# Patient Record
Sex: Female | Born: 1985 | Race: White | Hispanic: No | Marital: Married | Smoking: Never smoker
Health system: Southern US, Community
[De-identification: ages and names within clinical notes are randomized; demographics above are authoritative.]

## PROBLEM LIST (undated history)

## (undated) ENCOUNTER — Inpatient Hospital Stay (HOSPITAL_COMMUNITY): Payer: Self-pay

## (undated) DIAGNOSIS — Z8619 Personal history of other infectious and parasitic diseases: Secondary | ICD-10-CM

## (undated) DIAGNOSIS — IMO0002 Reserved for concepts with insufficient information to code with codable children: Secondary | ICD-10-CM

## (undated) DIAGNOSIS — B009 Herpesviral infection, unspecified: Secondary | ICD-10-CM

## (undated) DIAGNOSIS — N946 Dysmenorrhea, unspecified: Secondary | ICD-10-CM

## (undated) DIAGNOSIS — F329 Major depressive disorder, single episode, unspecified: Secondary | ICD-10-CM

## (undated) DIAGNOSIS — J45909 Unspecified asthma, uncomplicated: Secondary | ICD-10-CM

## (undated) DIAGNOSIS — F419 Anxiety disorder, unspecified: Secondary | ICD-10-CM

## (undated) DIAGNOSIS — F32A Depression, unspecified: Secondary | ICD-10-CM

## (undated) DIAGNOSIS — D649 Anemia, unspecified: Secondary | ICD-10-CM

## (undated) DIAGNOSIS — A64 Unspecified sexually transmitted disease: Secondary | ICD-10-CM

## (undated) DIAGNOSIS — D219 Benign neoplasm of connective and other soft tissue, unspecified: Secondary | ICD-10-CM

## (undated) HISTORY — DX: Anxiety disorder, unspecified: F41.9

## (undated) HISTORY — DX: Depression, unspecified: F32.A

## (undated) HISTORY — DX: Major depressive disorder, single episode, unspecified: F32.9

## (undated) HISTORY — DX: Dysmenorrhea, unspecified: N94.6

## (undated) HISTORY — DX: Unspecified sexually transmitted disease: A64

## (undated) HISTORY — DX: Reserved for concepts with insufficient information to code with codable children: IMO0002

---
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
## 2005-05-13 ENCOUNTER — Emergency Department (HOSPITAL_COMMUNITY): Admission: EM | Admit: 2005-05-13 | Discharge: 2005-05-13 | Payer: Self-pay | Admitting: Emergency Medicine

## 2007-09-30 ENCOUNTER — Emergency Department (HOSPITAL_COMMUNITY): Admission: EM | Admit: 2007-09-30 | Discharge: 2007-09-30 | Payer: Self-pay | Admitting: Emergency Medicine

## 2008-03-13 ENCOUNTER — Other Ambulatory Visit: Admission: RE | Admit: 2008-03-13 | Discharge: 2008-03-13 | Payer: Self-pay | Admitting: Family Medicine

## 2009-06-21 ENCOUNTER — Emergency Department (HOSPITAL_COMMUNITY): Admission: EM | Admit: 2009-06-21 | Discharge: 2009-06-21 | Payer: Self-pay | Admitting: Emergency Medicine

## 2010-04-26 ENCOUNTER — Emergency Department (HOSPITAL_COMMUNITY): Admission: EM | Admit: 2010-04-26 | Discharge: 2010-04-26 | Payer: Self-pay | Admitting: Family Medicine

## 2010-04-29 ENCOUNTER — Emergency Department (HOSPITAL_COMMUNITY): Admission: EM | Admit: 2010-04-29 | Discharge: 2010-04-29 | Payer: Self-pay | Admitting: Family Medicine

## 2010-08-24 LAB — POCT URINALYSIS DIP (DEVICE)
Bilirubin Urine: NEGATIVE
Glucose, UA: NEGATIVE mg/dL
Ketones, ur: NEGATIVE mg/dL
Nitrite: NEGATIVE
Protein, ur: >=300 mg/dL — AB
Specific Gravity, Urine: >=1.03 (ref 1.005–1.030)
Urobilinogen, UA: 0.2 mg/dL (ref 0.0–1.0)
pH: 6.5 (ref 5.0–8.0)

## 2010-08-24 LAB — POCT PREGNANCY, URINE: Preg Test, Ur: NEGATIVE

## 2011-03-03 LAB — POCT PREGNANCY, URINE
Operator id: 247071
Preg Test, Ur: NEGATIVE

## 2011-06-29 ENCOUNTER — Ambulatory Visit: Payer: 59

## 2011-06-29 NOTE — Progress Notes (Signed)
Patient seen for genetic counseling for family history of Huntington's Disease (in paternal aunt and paternal grandmother). Obtained medical and family histories. Referred patient to the [facility] multidisciplinary clinic for further evaluation and possible testing sincesince this clinic follows the HDSA evaluation and testing guidelines. Have asked patient to obtain a copy of her aunt or grandmother's test result confirming the diagnosis in the meantime. Patient agreed to referral. We will get her scheduled as soon as possible.

## 2011-07-06 ENCOUNTER — Other Ambulatory Visit: Payer: Self-pay | Admitting: Internal Medicine

## 2011-07-06 ENCOUNTER — Ambulatory Visit (INDEPENDENT_AMBULATORY_CARE_PROVIDER_SITE_OTHER): Payer: 59

## 2011-07-06 DIAGNOSIS — R05 Cough: Secondary | ICD-10-CM

## 2011-07-06 DIAGNOSIS — J029 Acute pharyngitis, unspecified: Secondary | ICD-10-CM

## 2011-07-08 ENCOUNTER — Telehealth: Payer: Self-pay

## 2011-07-08 NOTE — Telephone Encounter (Signed)
.  [facility] PT IS WORST - BOYFRIEND IS REALLY CONCERNED ABOUT HER CONDITION PLEASE CALL HIM AT [phone]

## 2011-07-09 LAB — CULTURE, GROUP A STREP: Organism ID, Bacteria: NORMAL

## 2011-07-09 NOTE — Telephone Encounter (Signed)
Spoke with boyfriend Amalia Hailey.  States Rayelle is worse, mainly her throat.  She is having trouble swallowing and some SOB.  Advised to return to clinic today.  Amalia Hailey states understanding and will return with Sharyl Nimrod today

## 2011-07-10 ENCOUNTER — Emergency Department (HOSPITAL_COMMUNITY)
Admission: EM | Admit: 2011-07-10 | Discharge: 2011-07-11 | Disposition: A | Discharge status: Home / Self Care | Payer: 59 | Attending: Emergency Medicine | Admitting: Emergency Medicine

## 2011-07-10 ENCOUNTER — Encounter (HOSPITAL_COMMUNITY): Payer: Self-pay | Admitting: Emergency Medicine

## 2011-07-10 DIAGNOSIS — R059 Cough, unspecified: Secondary | ICD-10-CM | POA: Insufficient documentation

## 2011-07-10 DIAGNOSIS — J3489 Other specified disorders of nose and nasal sinuses: Secondary | ICD-10-CM | POA: Insufficient documentation

## 2011-07-10 DIAGNOSIS — J9801 Acute bronchospasm: Secondary | ICD-10-CM | POA: Insufficient documentation

## 2011-07-10 DIAGNOSIS — J069 Acute upper respiratory infection, unspecified: Secondary | ICD-10-CM | POA: Insufficient documentation

## 2011-07-10 DIAGNOSIS — R05 Cough: Secondary | ICD-10-CM | POA: Insufficient documentation

## 2011-07-10 DIAGNOSIS — R0602 Shortness of breath: Secondary | ICD-10-CM | POA: Insufficient documentation

## 2011-07-10 DIAGNOSIS — J029 Acute pharyngitis, unspecified: Secondary | ICD-10-CM | POA: Insufficient documentation

## 2011-07-10 HISTORY — DX: Anemia, unspecified: D64.9

## 2011-07-10 NOTE — ED Notes (Signed)
Pt c/o sore throat  And cough was seen at a Urgent Care 3 days ago and had neg. Strep test.  St's she was given meds without relief.

## 2011-07-11 MED ORDER — PREDNISONE 20 MG PO TABS: ORAL_TABLET | ORAL | Status: DC

## 2011-07-11 MED ORDER — ALBUTEROL SULFATE HFA 108 (90 BASE) MCG/ACT IN AERS
4.0000 | INHALATION_SPRAY | Freq: Once | RESPIRATORY_TRACT | Status: AC
  Administered 2011-07-11: 4 via RESPIRATORY_TRACT
  Filled 2011-07-11: qty 6.7

## 2011-07-11 MED ORDER — PREDNISONE 20 MG PO TABS
60.0000 mg | ORAL_TABLET | Freq: Once | ORAL | Status: AC
  Administered 2011-07-11: 60 mg via ORAL
  Filled 2011-07-11: qty 3

## 2011-07-11 NOTE — ED Provider Notes (Signed)
History     CSN: [id number]  Arrival date & time 07/10/11  2226   First MD Initiated Contact with Patient 07/11/11 0021      Chief Complaint  Patient presents with  . Sore Throat    (Consider location/radiation/quality/duration/timing/severity/associated sxs/prior treatment) HPI This 26yo female has a one-week history of nasal congestion sore throat with a negative strep test at her primary care doctor cough with some shortness breath at times and maybe wheezing tonight so she came to the ED for evaluation. She is no fever tonight she did have a fever last week. She is no headache confusion rash chest pain abdominal pain vomiting or diarrhea. She no longer has myalgias or arthralgias no longer has fever for the last few days. Past Medical History  Diagnosis Date  . Anemia     Past Surgical History  Procedure Date  . Tonsillectomy     No family history on file.  History  Substance Use Topics  . Smoking status: Never Smoker   . Smokeless tobacco: Not on file  . Alcohol Use: Yes     ocassional    OB History    Grav Para Term Preterm Abortions TAB SAB Ect Mult Living                  Review of Systems  Constitutional: Negative for fever.       10 Systems reviewed and are negative for acute change except as noted in the HPI.  HENT: Positive for congestion and rhinorrhea.   Eyes: Negative for discharge and redness.  Respiratory: Positive for cough, shortness of breath and wheezing.   Cardiovascular: Negative for chest pain.  Gastrointestinal: Negative for vomiting and abdominal pain.  Musculoskeletal: Negative for back pain.  Skin: Negative for rash.  Neurological: Negative for syncope, numbness and headaches.  Psychiatric/Behavioral:       No behavior change.    Allergies  Review of patient's allergies indicates no known allergies.  Home Medications   Current Outpatient Rx  Name Route Sig Dispense Refill  . FEXOFENADINE HCL 180 MG PO TABS Oral Take 180 mg by  mouth daily.    . ADULT MULTIVITAMIN W/MINERALS CH Oral Take 1 tablet by mouth daily.    . SERTRALINE HCL 100 MG PO TABS Oral Take 150 mg by mouth daily.    Marland Kitchen PREDNISONE 20 MG PO TABS  2 tabs po daily x 4 days 8 tablet 0    BP 124/75  Pulse 72  Temp(Src) 98.2 F (36.8 C) (Oral)  Resp 20  SpO2 98%  LMP 07/10/2011  Physical Exam  Nursing note and vitals reviewed. Constitutional:       Awake, alert, nontoxic appearance.  HENT:  Head: Atraumatic.  Mouth/Throat: Oropharynx is clear and moist.  Eyes: Right eye exhibits no discharge. Left eye exhibits no discharge.  Neck: Neck supple.  Cardiovascular: Normal rate and regular rhythm.   No murmur heard. Pulmonary/Chest: Effort normal. No respiratory distress. She has wheezes. She has no rales. She exhibits no tenderness.       Patient has normal speech no retractions no accessory muscle usage and minimal end expiratory scattered wheezes with no rales and no rhonchi  Abdominal: Soft. There is no tenderness. There is no rebound.  Musculoskeletal: She exhibits no tenderness.       Baseline ROM, no obvious new focal weakness.  Neurological:       Mental status and motor strength appears baseline for patient and situation.  Skin:  No rash noted.  Psychiatric: She has a normal mood and affect.    ED Course  Procedures (including critical care time)  Labs Reviewed - No data to display No results found.   1. Bronchospasm   2. Upper respiratory infection       MDM  I doubt any other EMC precluding discharge at this time including, but not necessarily limited to the following:sepsis.        Hurman Horn, MD 07/12/11 [phone]

## 2011-07-17 ENCOUNTER — Ambulatory Visit (INDEPENDENT_AMBULATORY_CARE_PROVIDER_SITE_OTHER): Payer: 59 | Admitting: Emergency Medicine

## 2011-07-17 ENCOUNTER — Encounter: Payer: Self-pay | Admitting: Physician Assistant

## 2011-07-17 ENCOUNTER — Ambulatory Visit: Payer: Federal, State, Local not specified - PPO

## 2011-07-17 VITALS — BP 137/83 | HR 98 | Temp 98.2°F | Resp 18 | Ht 63.0 in | Wt 241.0 lb

## 2011-07-17 DIAGNOSIS — R5383 Other fatigue: Secondary | ICD-10-CM

## 2011-07-17 DIAGNOSIS — J329 Chronic sinusitis, unspecified: Secondary | ICD-10-CM

## 2011-07-17 DIAGNOSIS — R112 Nausea with vomiting, unspecified: Secondary | ICD-10-CM

## 2011-07-17 DIAGNOSIS — R5381 Other malaise: Secondary | ICD-10-CM

## 2011-07-17 DIAGNOSIS — R51 Headache: Secondary | ICD-10-CM

## 2011-07-17 DIAGNOSIS — R059 Cough, unspecified: Secondary | ICD-10-CM

## 2011-07-17 DIAGNOSIS — R05 Cough: Secondary | ICD-10-CM

## 2011-07-17 LAB — POCT CBC
Granulocyte percent: 74.3 %G (ref 37–80)
MCV: 90.8 fL (ref 80–97)
MID (cbc): 0.3 (ref 0–0.9)
MPV: 8.5 fL (ref 0–99.8)
POC MID %: 3.1 %M (ref 0–12)
Platelet Count, POC: 242 10*3/uL (ref 142–424)
RBC: 4.8 M/uL (ref 4.04–5.48)

## 2011-07-17 MED ORDER — IPRATROPIUM BROMIDE 0.03 % NA SOLN: 2.0000 | Freq: Two times a day (BID) | NASAL | Status: DC

## 2011-07-17 MED ORDER — CEFDINIR 300 MG PO CAPS: 600.0000 mg | ORAL_CAPSULE | Freq: Every day | ORAL | Status: AC

## 2011-07-17 MED ORDER — ACETAMINOPHEN 325 MG PO TABS
500.0000 mg | ORAL_TABLET | Freq: Once | ORAL | Status: AC
  Administered 2011-07-17: 487.5 mg via ORAL

## 2011-07-17 MED ORDER — IPRATROPIUM BROMIDE 0.02 % IN SOLN
0.5000 mg | Freq: Once | RESPIRATORY_TRACT | Status: AC
  Administered 2011-07-17: 0.5 mg via RESPIRATORY_TRACT

## 2011-07-17 MED ORDER — ALBUTEROL SULFATE (2.5 MG/3ML) 0.083% IN NEBU
2.5000 mg | INHALATION_SOLUTION | Freq: Once | RESPIRATORY_TRACT | Status: AC
  Administered 2011-07-17: 2.5 mg via RESPIRATORY_TRACT

## 2011-07-17 MED ORDER — ALBUTEROL SULFATE (2.5 MG/3ML) 0.083% IN NEBU: 2.5000 mg | INHALATION_SOLUTION | RESPIRATORY_TRACT | Status: DC | PRN

## 2011-07-17 MED ORDER — GUAIFENESIN ER 1200 MG PO TB12: 1.0000 | ORAL_TABLET | Freq: Two times a day (BID) | ORAL | Status: DC | PRN

## 2011-07-17 NOTE — Patient Instructions (Signed)
Lots of Rest. Lots of Liquids. Advance diet as tolerated-go slow. Re-evaluate if your symptoms worsen or persist.

## 2011-07-17 NOTE — Progress Notes (Signed)
Subjective:    Patient ID: Renee Stafford, female    DOB: 1985/06/12, 26 y.o.   MRN: [id number]  HPI The patient presents, accompanied by her husband, with persistent illness. Originally seen here 07/01/2011 and diagnosed with a viral illness, likely the flu. Her symptoms have been present for nearly a week and so she was not a candidate for Tamiflu. Supportive measures were not helpful and on 07/10/2011 she presented to the emergency department. There she was diagnosed with bronchospasm and given a prednisone taper.  Today she complains of ongoing severe fatigue as well as cough and chest heaviness. No fever or chills. No myalgias, arthralgias or rash. She has headache today. No visual disturbance. Nausea and vomiting began this morning. She complains of wanting to eat but her husband recommends against it.  It is notable that her husband answers all questions, even that is specifically directed to the patient. He provides most of the history and asks all of the questions. When queried privately, the patient states that it is okay with her about her husband does all the talking at her visit. She denies feeling pregnant.   Review of Systems  Constitutional: Positive for activity change and fatigue. Negative for fever, chills, diaphoresis, appetite change and unexpected weight change.  HENT: Positive for congestion, rhinorrhea and postnasal drip. Negative for ear pain, neck pain and neck stiffness.   Eyes: Negative for visual disturbance.  Respiratory: Positive for cough and chest tightness. Negative for shortness of breath and wheezing.   Cardiovascular: Negative for chest pain and palpitations.  Gastrointestinal: Positive for nausea and vomiting.  Genitourinary: Negative for dysuria, urgency and frequency.  Musculoskeletal: Negative for myalgias and arthralgias.  Skin: Positive for pallor. Negative for color change.  Neurological: Positive for headaches. Negative for dizziness, weakness and  light-headedness.  Hematological: Negative for adenopathy.       Objective:   Physical Exam  Constitutional: She is oriented to person, place, and time. Vital signs are normal. She appears well-developed and well-nourished. No distress.  HENT:  Head: Normocephalic and atraumatic.  Right Ear: Hearing, tympanic membrane, external ear and ear canal normal.  Left Ear: Hearing, tympanic membrane, external ear and ear canal normal.  Nose: Nose normal. Right sinus exhibits no maxillary sinus tenderness and no frontal sinus tenderness. Left sinus exhibits no maxillary sinus tenderness and no frontal sinus tenderness.  Mouth/Throat: Uvula is midline, oropharynx is clear and moist and mucous membranes are normal. No oropharyngeal exudate.  Eyes: Conjunctivae and EOM are normal. Pupils are equal, round, and reactive to light. Right eye exhibits no discharge. Left eye exhibits no discharge. No scleral icterus.  Neck: Trachea normal, normal range of motion and full passive range of motion without pain. Neck supple. No mass and no thyromegaly present.  Cardiovascular: Normal rate, regular rhythm and normal heart sounds.   Pulses:      Radial pulses are 2+ on the right side, and 2+ on the left side.  Pulmonary/Chest: Effort normal and breath sounds normal.  Abdominal: Soft. Normal appearance and bowel sounds are normal. She exhibits no distension and no mass. There is no hepatosplenomegaly. There is no tenderness. There is no rebound, no guarding and no CVA tenderness. No hernia.  Musculoskeletal: Normal range of motion.       Cervical back: Normal.  Lymphadenopathy:       Head (right side): No tonsillar, no preauricular, no posterior auricular and no occipital adenopathy present.       Head (left side):  No tonsillar, no preauricular, no posterior auricular and no occipital adenopathy present.    She has no cervical adenopathy.       Right cervical: No superficial cervical, no deep cervical and no  posterior cervical adenopathy present.      Left cervical: No superficial cervical, no deep cervical and no posterior cervical adenopathy present.       Right: No supraclavicular adenopathy present.       Left: No supraclavicular adenopathy present.  Neurological: She is alert and oriented to person, place, and time. She has normal strength. No cranial nerve deficit.  Skin: Skin is warm, dry and intact. No rash noted.  Psychiatric: She has a normal mood and affect. Her speech is normal and behavior is normal. Judgment and thought content normal.     CXR: [facility] reading (PRIMARY) by  Dr. Cleta Alberts. Mild increased markings rt. base. Otherwise normal.  After neb treatment of albuterol and atrovent, the patient states that her symptoms are improved.  Results for orders placed in visit on 07/17/11  POCT CBC      Component Value Range   WBC 11.0 (*) 4.6 - 10.2 (K/uL)   Lymph, poc 2.5  0.6 - 3.4    POC LYMPH PERCENT 22.6  10 - 50 (%L)   MID (cbc) 0.3  0 - 0.9    POC MID % 3.1  0 - 12 (%M)   POC Granulocyte 8.2 (*) 2 - 6.9    Granulocyte percent 74.3  37 - 80 (%G)   RBC 4.80  4.04 - 5.48 (M/uL)   Hemoglobin 14.1  12.2 - 16.2 (g/dL)   HCT, POC 16.1  09.6 - 47.9 (%)   MCV 90.8  80 - 97 (fL)   MCH, POC 29.4  27 - 31.2 (pg)   MCHC 32.3  31.8 - 35.4 (g/dL)   RDW, POC 04.5     Platelet Count, POC 242  142 - 424 (K/uL)   MPV 8.5  0 - 99.8 (fL)      Assessment & Plan:   1. Sinusitis  ipratropium (ATROVENT) 0.03 % nasal spray, Guaifenesin (MUCINEX MAXIMUM STRENGTH) 1200 MG TB12, cefdinir (OMNICEF) 300 MG capsule  2. Fatigue  POCT CBC, Epstein-Barr virus VCA antibody panel, Comprehensive metabolic panel  3. Cough  POCT CBC, DG Chest 2 View, albuterol (PROVENTIL) (2.5 MG/3ML) 0.083% nebulizer solution 2.5 mg, ipratropium (ATROVENT) nebulizer solution 0.5 mg, Guaifenesin (MUCINEX MAXIMUM STRENGTH) 1200 MG TB12, albuterol (PROVENTIL) (2.5 MG/3ML) 0.083% nebulizer solution  4. Nausea & vomiting   Comprehensive metabolic panel  5. Headache  acetaminophen (TYLENOL) tablet 487.5 mg   Rest, fluids, acetominophen.  Push fluids.  Discussed with Dr. Cleta Alberts.

## 2011-07-18 LAB — COMPREHENSIVE METABOLIC PANEL
AST: 14 U/L (ref 0–37)
Alkaline Phosphatase: 71 U/L (ref 39–117)
BUN: 16 mg/dL (ref 6–23)
Creat: 0.71 mg/dL (ref 0.50–1.10)
Total Bilirubin: 0.3 mg/dL (ref 0.3–1.2)

## 2011-07-20 LAB — EPSTEIN-BARR VIRUS VCA ANTIBODY PANEL
EBV EA IgG: 2.43 {ISR} — ABNORMAL HIGH
EBV VCA IgG: 3.85 {ISR} — ABNORMAL HIGH
EBV VCA IgM: 0.36 {ISR}

## 2011-07-21 NOTE — Progress Notes (Signed)
Quick Note:  Explained results and instructions to pt ______

## 2011-08-22 ENCOUNTER — Emergency Department (INDEPENDENT_AMBULATORY_CARE_PROVIDER_SITE_OTHER)
Admission: EM | Admit: 2011-08-22 | Discharge: 2011-08-22 | Disposition: A | Discharge status: Home / Self Care | Payer: 59 | Source: Home / Self Care | Attending: Family Medicine | Admitting: Family Medicine

## 2011-08-22 ENCOUNTER — Encounter (HOSPITAL_COMMUNITY): Payer: Self-pay

## 2011-08-22 DIAGNOSIS — N949 Unspecified condition associated with female genital organs and menstrual cycle: Secondary | ICD-10-CM

## 2011-08-22 DIAGNOSIS — R102 Pelvic and perineal pain: Secondary | ICD-10-CM

## 2011-08-22 MED ORDER — SILVER SULFADIAZINE 1 % EX CREA: TOPICAL_CREAM | Freq: Two times a day (BID) | CUTANEOUS | Status: DC

## 2011-08-22 MED ORDER — VALACYCLOVIR HCL 1 G PO TABS: 1000.0000 mg | ORAL_TABLET | Freq: Two times a day (BID) | ORAL | Status: DC

## 2011-08-22 NOTE — Discharge Instructions (Signed)
Wash gently and re-apply cream twice daily, take all of pills and we will call with test results.

## 2011-08-22 NOTE — ED Provider Notes (Signed)
History     CSN: [id number]  Arrival date & time 08/22/11  1200   First MD Initiated Contact with Patient 08/22/11 1230      Chief Complaint  Patient presents with  . Vaginal Pain    (Consider location/radiation/quality/duration/timing/severity/associated sxs/prior treatment) Patient is a 26 y.o. female presenting with vaginal itching. The history is provided by the patient.  Vaginal Itching This is a new problem. The current episode started more than 2 days ago (vaginal tear from sex with secondary, blisters after). The problem occurs constantly.    Past Medical History  Diagnosis Date  . Anemia   . Vitamin d deficiency   . Cancer     Past Surgical History  Procedure Date  . Tonsillectomy     History reviewed. No pertinent family history.  History  Substance Use Topics  . Smoking status: Never Smoker   . Smokeless tobacco: Not on file  . Alcohol Use: Yes     ocassional    OB History    Grav Para Term Preterm Abortions TAB SAB Ect Mult Living                  Review of Systems  Constitutional: Negative.   Genitourinary: Positive for genital sores, vaginal pain and pelvic pain.    Allergies  Seasonal  Home Medications   Current Outpatient Rx  Name Route Sig Dispense Refill  . ALBUTEROL SULFATE (2.5 MG/3ML) 0.083% IN NEBU Nebulization Take 3 mLs (2.5 mg total) by nebulization every 4 (four) hours as needed for wheezing or shortness of breath (cough). 150 mL 1  . FEXOFENADINE HCL 180 MG PO TABS Oral Take 180 mg by mouth daily.    . GUAIFENESIN ER 1200 MG PO TB12 Oral Take 1 tablet (1,200 mg total) by mouth every 12 (twelve) hours as needed. 14 tablet 1  . IPRATROPIUM BROMIDE 0.03 % NA SOLN Nasal Place 2 sprays into the nose 2 (two) times daily. 30 mL 5  . ADULT MULTIVITAMIN W/MINERALS CH Oral Take 1 tablet by mouth daily.    . SERTRALINE HCL 100 MG PO TABS Oral Take 150 mg by mouth daily.    Marland Kitchen SILVER SULFADIAZINE 1 % EX CREA Topical Apply topically 2  (two) times daily. 50 g 0  . VALACYCLOVIR HCL 1 G PO TABS Oral Take 1 tablet (1,000 mg total) by mouth 2 (two) times daily. 20 tablet 0    BP 140/94  Pulse 70  Temp(Src) 98 F (36.7 C) (Oral)  Resp 18  SpO2 97%  LMP 08/15/2011  Physical Exam  Nursing note and vitals reviewed. Constitutional: She appears well-developed and well-nourished.  Abdominal: Soft. Bowel sounds are normal. There is no tenderness.  Genitourinary:     Lymphadenopathy:       Right: Inguinal adenopathy present.       Left: Inguinal adenopathy present.  Skin: Skin is warm and dry.    ED Course  Procedures (including critical care time)   Labs Reviewed  HERPES SIMPLEX VIRUS CULTURE   No results found.   1. Vaginal pain       MDM          Linna Hoff, MD 08/22/11 1400

## 2011-08-22 NOTE — ED Notes (Addendum)
Pt states that she are her boyfriend had sex Tuesday and had a vaginal tear, pt states she has blisters on her vagina and rectum that are itching, burning, clear drainage. Pt states she has been using hydrocortisone and cream and neosporin with no relieve, pt states pain level is 10

## 2011-08-24 ENCOUNTER — Telehealth (HOSPITAL_COMMUNITY): Payer: Self-pay | Admitting: *Deleted

## 2011-08-24 LAB — HERPES SIMPLEX VIRUS CULTURE: Culture: DETECTED

## 2011-08-24 NOTE — ED Notes (Signed)
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 Pt. verified x 2 and given results. Pt. instructed to notify her partner. You can pass the virus even when you don't have an outbreak, so always practice safe sex. Get treated for each outbreak with Acyclovir or Valtrex. You may want to get an OB-GYN doctor who can call in a prescription for you when you have an outbreak or give you a Rx. with refills for 1 yr so she can take it when she does have an outbreak. Pt. voiced understanding.        Pt. had called @ 1217 and requested a work note. Discussed with Dr. Artis Flock and he said she can go back when she feels like she can.  Note done for 3 days. Pt. instructed to come back and pick up the note at the front desk. If she can't return tomorrow, she will have to come back and be rechecked or seen by her PCP. Pt. voiced understanding. Vassie Moselle 08/24/2011

## 2011-08-25 ENCOUNTER — Emergency Department (INDEPENDENT_AMBULATORY_CARE_PROVIDER_SITE_OTHER)
Admission: EM | Admit: 2011-08-25 | Discharge: 2011-08-25 | Disposition: A | Discharge status: Home / Self Care | Payer: 59 | Source: Home / Self Care | Attending: Family Medicine | Admitting: Family Medicine

## 2011-08-25 ENCOUNTER — Encounter (HOSPITAL_COMMUNITY): Payer: Self-pay | Admitting: Emergency Medicine

## 2011-08-25 DIAGNOSIS — A6000 Herpesviral infection of urogenital system, unspecified: Secondary | ICD-10-CM

## 2011-08-25 MED ORDER — LIDOCAINE 5 % EX OINT: TOPICAL_OINTMENT | CUTANEOUS | Status: DC

## 2011-08-25 NOTE — ED Notes (Signed)
Pt. Stated, I was dx with Herpes last Tuesday and I need a Dr's note to be out of work cause I'm in so much pain I can't even walk up and down my steps at my apartment it hurts on my anus.

## 2011-08-25 NOTE — Discharge Instructions (Signed)
Complete valacyclovir as previously prescribed. Use the prescribed cream as instructed to help with the pain.  Genital Herpes Genital herpes is a sexually transmitted disease. This means that it is a disease passed by having sex with an infected person. There is no cure for genital herpes. The time between attacks can be months to years. The virus may live in a person but produce no problems (symptoms). This infection can be passed to a baby as it travels down the birth canal (vagina). In a newborn, this can cause central nervous system damage, eye damage, or even death. The virus that causes genital herpes is usually HSV-2 virus. The virus that causes oral herpes is usually HSV-1. The diagnosis (learning what is wrong) is made through culture results. SYMPTOMS  Usually symptoms of pain and itching begin a few days to a week after contact. It first appears as small blisters that progress to small painful ulcers which then scab over and heal after several days. It affects the outer genitalia, birth canal, cervix, penis, anal area, buttocks, and thighs. HOME CARE INSTRUCTIONS   Keep ulcerated areas dry and clean.   Take medications as directed. Antiviral medications can speed up healing. They will not prevent recurrences or cure this infection. These medications can also be taken for suppression if there are frequent recurrences.   While the infection is active, it is contagious. Avoid all sexual contact during active infections.   Condoms may help prevent spread of the herpes virus.   Practice safe sex.   Wash your hands thoroughly after touching the genital area.   Avoid touching your eyes after touching your genital area.   Inform your caregiver if you have had genital herpes and become pregnant. It is your responsibility to insure a safe outcome for your baby in this pregnancy.   Only take over-the-counter or prescription medicines for pain, discomfort, or fever as directed by your  caregiver.  SEEK MEDICAL CARE IF:   You have a recurrence of this infection.   You do not respond to medications and are not improving.   You have new sources of pain or discharge which have changed from the original infection.   You have an oral temperature above 102 F (38.9 C).   You develop abdominal pain.   You develop eye pain or signs of eye infection.  Document Released: 05/22/2000 Document Revised: 05/14/2011 Document Reviewed: 06/12/2009 [facility] Patient Information 2012 [place], [place].

## 2011-08-28 ENCOUNTER — Telehealth (HOSPITAL_COMMUNITY): Payer: Self-pay | Admitting: *Deleted

## 2011-08-28 NOTE — ED Notes (Signed)
0801 Pt.'s husband lost his wife's work note and needs a copy of it.  States she was seen Sat. and another day this week. 1325 Pt.'s husband called again and would not put message on VM. Adam said he insisted on talking to me now. I told him I got here at 1130 and had 2 rabies shots to do. I told him I had just picked up his VM but have not had a chance to work on it yet. He said he only needs a duplicate note for 3/16. I told him I could see her note from that day and would reprint it and leave it at the front desk. He said he won't be able to get it until 1700. I told him that was fine. Vassie Moselle 08/28/2011

## 2011-08-28 NOTE — ED Provider Notes (Signed)
History     CSN: [id number]  Arrival date & time 08/25/11  1846   First MD Initiated Contact with Patient 08/25/11 1930      Chief Complaint  Patient presents with  . Wound Check    (Consider location/radiation/quality/duration/timing/severity/associated sxs/prior treatment) HPI Comments: 26 y/o female recently diagnosed with genital herpes primary infection 3 days ago. Taking valtrex but pain persist. Unable to seat comfortable, wants a work note as she is very uncomfortable through the day. Using silvadene cream. No fever. No other pan medications.    Past Medical History  Diagnosis Date  . Anemia   . Vitamin d deficiency   . Cancer     Past Surgical History  Procedure Date  . Tonsillectomy     No family history on file.  History  Substance Use Topics  . Smoking status: Never Smoker   . Smokeless tobacco: Not on file  . Alcohol Use: Yes     ocassional    OB History    Grav Para Term Preterm Abortions TAB SAB Ect Mult Living                  Review of Systems  All other systems reviewed and are negative.    Allergies  Seasonal  Home Medications   Current Outpatient Rx  Name Route Sig Dispense Refill  . ALBUTEROL SULFATE (2.5 MG/3ML) 0.083% IN NEBU Nebulization Take 3 mLs (2.5 mg total) by nebulization every 4 (four) hours as needed for wheezing or shortness of breath (cough). 150 mL 1  . FEXOFENADINE HCL 180 MG PO TABS Oral Take 180 mg by mouth daily.    . GUAIFENESIN ER 1200 MG PO TB12 Oral Take 1 tablet (1,200 mg total) by mouth every 12 (twelve) hours as needed. 14 tablet 1  . IPRATROPIUM BROMIDE 0.03 % NA SOLN Nasal Place 2 sprays into the nose 2 (two) times daily. 30 mL 5  . LIDOCAINE 5 % EX OINT  Apply every 4-6 hours as needed for pain 35.44 g 0  . ADULT MULTIVITAMIN W/MINERALS CH Oral Take 1 tablet by mouth daily.    . SERTRALINE HCL 100 MG PO TABS Oral Take 150 mg by mouth daily.    Marland Kitchen SILVER SULFADIAZINE 1 % EX CREA Topical Apply topically 2  (two) times daily. 50 g 0  . VALACYCLOVIR HCL 1 G PO TABS Oral Take 1 tablet (1,000 mg total) by mouth 2 (two) times daily. 20 tablet 0    BP 161/84  Pulse 82  Temp(Src) 98.3 F (36.8 C) (Oral)  Resp 16  SpO2 100%  LMP 08/15/2011  Physical Exam  Nursing note and vitals reviewed. Constitutional: She is oriented to person, place, and time. She appears well-developed and well-nourished.       apears anxious and perseverating about pain. Eye tearing.   Cardiovascular: Normal heart sounds.   Pulmonary/Chest: Breath sounds normal.  Abdominal: Soft. There is no tenderness.       Bilateral inguinal mildly enlarged tender lymph nodes.  Lymphadenopathy:    She has no cervical adenopathy.  Neurological: She is alert and oriented to person, place, and time.  Skin:       Gluteal and perineal, perianal genital herpes vesicles some vesicles are unroofed with denudates skin there is an erythematous base. No pustules, indurations or fluctuations. No purulent drainage.     ED Course  Procedures (including critical care time)  Labs Reviewed - No data to display No results found.  1. Genital herpes       MDM  Apply viscous lidocaine and patient reported significant improvement of her pain. Prescribed lidocaine ointment 5% to use prn. Complete valtrex therapy.        Sharin Grave, MD 08/28/11 1339

## 2012-05-01 ENCOUNTER — Ambulatory Visit (INDEPENDENT_AMBULATORY_CARE_PROVIDER_SITE_OTHER): Payer: 59 | Admitting: Family Medicine

## 2012-05-01 VITALS — BP 111/79 | HR 121 | Temp 100.0°F | Resp 18 | Ht 63.5 in | Wt 235.0 lb

## 2012-05-01 DIAGNOSIS — R509 Fever, unspecified: Secondary | ICD-10-CM

## 2012-05-01 DIAGNOSIS — N912 Amenorrhea, unspecified: Secondary | ICD-10-CM

## 2012-05-01 DIAGNOSIS — J329 Chronic sinusitis, unspecified: Secondary | ICD-10-CM

## 2012-05-01 LAB — POCT CBC
HCT, POC: 45 % (ref 37.7–47.9)
Hemoglobin: 13.8 g/dL (ref 12.2–16.2)
Lymph, poc: 1.8 (ref 0.6–3.4)
MCHC: 30.7 g/dL — AB (ref 31.8–35.4)
MCV: 92.3 fL (ref 80–97)
POC LYMPH PERCENT: 13.6 %L (ref 10–50)
RDW, POC: 13.8 %

## 2012-05-01 LAB — POCT INFLUENZA A/B: Influenza B, POC: NEGATIVE

## 2012-05-01 MED ORDER — CEFDINIR 300 MG PO CAPS: 300.0000 mg | ORAL_CAPSULE | Freq: Two times a day (BID) | ORAL | Status: DC

## 2012-05-01 NOTE — Patient Instructions (Addendum)
Please let us know if you are not better in the next couple of days.

## 2012-05-01 NOTE — Progress Notes (Signed)
[facility] [address] [place] [address] [phone]  Date:  05/01/2012   Name:  JANAKI SISLER   DOB:  [birth year]   MRN:  [id number]  PCP:  Cala Bradford, MD    Chief Complaint: URI   History of Present Illness:  AMAL CLOWES is a 26 y.o. very pleasant female patient who presents with the following:  She is here with illness today.  She noted the onset of sinus congestion and pressure yesterday. Her symptoms came on fairly suddenly.  She has noted a subjective fever and chills.  She did not check her temperature yet.   She did vomit this am, but she thinks this was due to taking medication that did not agree with her. Right now she is not nauseated and she has no abdominal pain, no diarrhea.  Able to tolerate liquids.   She does note a cough.    She does not notice a ST or eararche.   She feels very tired and her body aches.    She is not sure if she might be pregnant- she is not sure of the date of her LMP  There is no problem list on file for this patient.   Past Medical History  Diagnosis Date  . Anemia   . Vitamin D deficiency   . Cancer   . Allergy   . Depression   . Anxiety     Past Surgical History  Procedure Date  . Tonsillectomy     History  Substance Use Topics  . Smoking status: Never Smoker   . Smokeless tobacco: Not on file  . Alcohol Use: Yes     Comment: ocassional    No family history on file.  Allergies  Allergen Reactions  . Cholestatin     Also allergic to dust, cats, dogs, mold    Medication list has been reviewed and updated.  Current Outpatient Prescriptions on File Prior to Visit  Medication Sig Dispense Refill  . fexofenadine (ALLEGRA) 180 MG tablet Take 180 mg by mouth daily.      Marland Kitchen ipratropium (ATROVENT) 0.03 % nasal spray Place 2 sprays into the nose 2 (two) times daily.  30 mL  5  . Multiple Vitamin (MULITIVITAMIN WITH MINERALS) TABS Take 1 tablet by mouth daily.      . sertraline  (ZOLOFT) 100 MG tablet Take 150 mg by mouth daily.      Marland Kitchen albuterol (PROVENTIL) (2.5 MG/3ML) 0.083% nebulizer solution Take 3 mLs (2.5 mg total) by nebulization every 4 (four) hours as needed for wheezing or shortness of breath (cough).  150 mL  1  . Guaifenesin (MUCINEX MAXIMUM STRENGTH) 1200 MG TB12 Take 1 tablet (1,200 mg total) by mouth every 12 (twelve) hours as needed.  14 tablet  1  . lidocaine (XYLOCAINE) 5 % ointment Apply every 4-6 hours as needed for pain  35.44 g  0  . silver sulfADIAZINE (SILVADENE) 1 % cream Apply topically 2 (two) times daily.  50 g  0  . valACYclovir (VALTREX) 1000 MG tablet Take 1 tablet (1,000 mg total) by mouth 2 (two) times daily.  20 tablet  0    Review of Systems:  As per HPI- otherwise negative.   Physical Examination: Filed Vitals:   05/01/12 1111  BP: 111/79  Pulse: 121  Temp: 100 F (37.8 C)  Resp: 18   Filed Vitals:   05/01/12 1111  Height: 5' 3.5" (1.613 m)  Weight: 235  lb (106.595 kg)   Body mass index is 40.98 kg/(m^2). Ideal Body Weight: Weight in (lb) to have BMI = 25: 143.1   GEN: WDWN, NAD, Non-toxic, A & O x 3, overweight.  Does not appear acutely ill HEENT: Atraumatic, Normocephalic. Neck supple. No masses, No LAD. Bilateral TM wnl, oropharynx normal.  PEERL,EOMI.  Nasal cavity is congested Ears and Nose: No external deformity. CV: RRR, No M/G/R. No JVD. No thrill. No extra heart sounds. PULM: CTA B, no wheezes, crackles, rhonchi. No retractions. No resp. distress. No accessory muscle use. ABD: S, NT, ND, +BS. No rebound. No HSM. EXTR: No c/c/e NEURO Normal gait.  PSYCH: Normally interactive. Conversant. Not depressed or anxious appearing.  Calm demeanor.   Results for orders placed in visit on 05/01/12  POCT CBC      Component Value Range   WBC 13.3 (*) 4.6 - 10.2 K/uL   Lymph, poc 1.8  0.6 - 3.4   POC LYMPH PERCENT 13.6  10 - 50 %L   MID (cbc) 0.6  0 - 0.9   POC MID % 4.5  0 - 12 %M   POC Granulocyte 10.9 (*) 2 -  6.9   Granulocyte percent 81.9 (*) 37 - 80 %G   RBC 4.88  4.04 - 5.48 M/uL   Hemoglobin 13.8  12.2 - 16.2 g/dL   HCT, POC 16.1  09.6 - 47.9 %   MCV 92.3  80 - 97 fL   MCH, POC 28.3  27 - 31.2 pg   MCHC 30.7 (*) 31.8 - 35.4 g/dL   RDW, POC 04.5     Platelet Count, POC 197  142 - 424 K/uL   MPV 9.2  0 - 99.8 fL  POCT INFLUENZA A/B      Component Value Range   Influenza A, POC Negative     Influenza B, POC Negative    POCT URINE PREGNANCY      Component Value Range   Preg Test, Ur Negative      Assessment and Plan: 1. Fever  POCT CBC, POCT Influenza A/B  2. Amenorrhea  POCT urine pregnancy  3. Sinusitis  cefdinir (OMNICEF) 300 MG capsule   Ivry is here with sinus pain and pressure, as well as a fever and leukocytosis today.  Flu is possible- she did not tolerate the test well so the swab was not excellent- however her elevated wbc count suggests bacteria.  Will treat her with omnicef. She will let me know if not better soon- Sooner if worse.   Did a note for her job.   Asked her to continue taking an OTC HCG test every few days until her menses come.  She is not sure of the date of her last period but upon further thought she does not think she is actually late.    Abbe Amsterdam, MD

## 2012-07-16 ENCOUNTER — Ambulatory Visit (INDEPENDENT_AMBULATORY_CARE_PROVIDER_SITE_OTHER): Payer: 59 | Admitting: Family Medicine

## 2012-07-16 VITALS — BP 127/84 | HR 86 | Temp 98.8°F | Resp 16 | Ht 63.5 in | Wt 246.0 lb

## 2012-07-16 DIAGNOSIS — B9789 Other viral agents as the cause of diseases classified elsewhere: Secondary | ICD-10-CM

## 2012-07-16 DIAGNOSIS — J45909 Unspecified asthma, uncomplicated: Secondary | ICD-10-CM

## 2012-07-16 DIAGNOSIS — B349 Viral infection, unspecified: Secondary | ICD-10-CM

## 2012-07-16 DIAGNOSIS — J069 Acute upper respiratory infection, unspecified: Secondary | ICD-10-CM

## 2012-07-16 MED ORDER — HYDROCODONE-HOMATROPINE 5-1.5 MG/5ML PO SYRP: 5.0000 mL | ORAL_SOLUTION | ORAL | Status: DC | PRN

## 2012-07-16 MED ORDER — BENZONATATE 100 MG PO CAPS: 100.0000 mg | ORAL_CAPSULE | Freq: Three times a day (TID) | ORAL | Status: DC | PRN

## 2012-07-16 NOTE — Progress Notes (Signed)
Subjective: Patient has been ill since Tuesday with a upper sparked right infection and cough. She's been running a fever the last couple of days. She has a headache. Not much body aches. She did not get a flu shot this year. She has been wheezing. She has a history of asthma problems.  Objective: Her TMs are normal. Throat clear. Nose congested. Neck supple without significant nodes. Chest has wheezing in the lower lung fields, left more than right. Heart regular without murmurs. I do not hear wheezing in front.  Assessment: Asthmatic bronchitis, viral syndrome, URI  Plan: Patient would not allow me to do a flu swab, violently pushed away.  She already has an albuterol inhaler and albuterol for her nebulizer at home. Advised he said about 4 times daily. She R. any has some Atrovent nasal which I suggested she use. Prescribed Hycodan syrup and Tessalon for cough. Do not think antibiotics are indicated. Return if worse. Stay off work for the next 2 days

## 2012-07-16 NOTE — Patient Instructions (Addendum)
Drink lots of fluids  Get plenty of rest  Stay off work today and tomorrow  If worse return  Take cough pills when you don't want to be sedated, and cough syrup when you don't mind being sedated.

## 2012-10-20 ENCOUNTER — Other Ambulatory Visit: Payer: Self-pay | Admitting: Obstetrics and Gynecology

## 2012-10-20 ENCOUNTER — Encounter: Payer: Self-pay | Admitting: Obstetrics and Gynecology

## 2012-10-20 ENCOUNTER — Ambulatory Visit (INDEPENDENT_AMBULATORY_CARE_PROVIDER_SITE_OTHER): Payer: 59 | Admitting: Obstetrics and Gynecology

## 2012-10-20 VITALS — BP 120/84 | Ht 62.5 in | Wt 256.0 lb

## 2012-10-20 DIAGNOSIS — Z0000 Encounter for general adult medical examination without abnormal findings: Secondary | ICD-10-CM

## 2012-10-20 DIAGNOSIS — Z01419 Encounter for gynecological examination (general) (routine) without abnormal findings: Secondary | ICD-10-CM

## 2012-10-20 DIAGNOSIS — E669 Obesity, unspecified: Secondary | ICD-10-CM

## 2012-10-20 DIAGNOSIS — Z113 Encounter for screening for infections with a predominantly sexual mode of transmission: Secondary | ICD-10-CM

## 2012-10-20 DIAGNOSIS — N6452 Nipple discharge: Secondary | ICD-10-CM

## 2012-10-20 DIAGNOSIS — R5383 Other fatigue: Secondary | ICD-10-CM

## 2012-10-20 DIAGNOSIS — N6459 Other signs and symptoms in breast: Secondary | ICD-10-CM

## 2012-10-20 LAB — POCT URINALYSIS DIPSTICK
Glucose, UA: NEGATIVE
Leukocytes, UA: NEGATIVE
Urobilinogen, UA: NEGATIVE

## 2012-10-20 LAB — COMPREHENSIVE METABOLIC PANEL
Albumin: 4.2 g/dL (ref 3.5–5.2)
BUN: 16 mg/dL (ref 6–23)
CO2: 24 mEq/L (ref 19–32)
Calcium: 8.9 mg/dL (ref 8.4–10.5)
Chloride: 104 mEq/L (ref 96–112)
Creat: 0.74 mg/dL (ref 0.50–1.10)
Glucose, Bld: 82 mg/dL (ref 70–99)
Potassium: 4.1 mEq/L (ref 3.5–5.3)

## 2012-10-20 LAB — CBC
HCT: 42.5 % (ref 36.0–46.0)
Hemoglobin: 14.6 g/dL (ref 12.0–15.0)
MCV: 87.4 fL (ref 78.0–100.0)
RBC: 4.86 MIL/uL (ref 3.87–5.11)
RDW: 14.1 % (ref 11.5–15.5)
WBC: 9.8 10*3/uL (ref 4.0–10.5)

## 2012-10-20 LAB — LIPID PANEL
Cholesterol: 171 mg/dL (ref 0–200)
Total CHOL/HDL Ratio: 4.5 Ratio

## 2012-10-20 MED ORDER — DROSPIRENONE-ETHINYL ESTRADIOL 3-0.02 MG PO TABS: 1.0000 | ORAL_TABLET | Freq: Every day | ORAL | Status: DC

## 2012-10-20 NOTE — Progress Notes (Signed)
Patient ID: Renee Stafford, female   DOB: 19-Feb-1986, 27 y.o.   MRN: [id number] 27 y.o.   Single    Caucasian   female   G0P0   here for annual exam.   Wants Rx for birth control pills.  Used in the past.  Have decreased libido.   History of heavy periods and painful.  Menses every month.  Takes ibuprofen.  Mood swings around menses time. Off Zoloft in the past.  Stopped one year ago.  Was in a bad relationship.  Now in a good one. Having some social anxiety.  Will be seeing her PCP to discuss this further.  Tired.  Self treating with iron due to fatigue a month and a half ago. Feels better now.  Had thyroid check at [facility] when had evaluation for Huntington's Chorrhea due to family history of paternal grandmother with this disease.   Diagnosis of HSV II. Had an outbreak one and a half years ago, when was diagnosed.    Patient and her mother are concerned about ovarian cancer.  Asking multiple questions.  Mother's friend just died of ovarian cancer.  Some joint pain.  Is a dog groomer.    Legs are sore and ankles sore and weak.  Lives on a third floor.  Stands at work.  Thinks she may need to loose weight to help this.    Patient's last menstrual period was 10/06/2012.          Sexually active: yes  The current method of family planning is condoms sometimes.    Exercising: no Last mammogram:  never Last pap smear: 2years ago:wnl History of abnormal pap: no Smoking: socially. Alcohol: socially Last colonoscopy: never Last Bone Density:  never Last tetanus shot: 5 years ago Last cholesterol check: never  Hgb:  15.0              Urine: Neg   Family History  Problem Relation Age of Onset  . Hypertension Father   . Hyperlipidemia Father   . Diabetes Maternal Grandmother     There are no active problems to display for this patient.   Past Medical History  Diagnosis Date  . Anemia   . Vitamin D deficiency   . Allergy   . Depression   . Anxiety   . Dysmenorrhea   .  Dyspareunia   . STD (sexually transmitted disease)     Herpes    Past Surgical History  Procedure Laterality Date  . Tonsillectomy      Allergies: Cholestatin  Current Outpatient Prescriptions  Medication Sig Dispense Refill  . cholecalciferol (VITAMIN D) 1000 UNITS tablet Take 1,000 Units by mouth daily.      . ferrous fumarate (HEMOCYTE - 106 MG FE) 325 (106 FE) MG TABS Take 1 tablet by mouth.      . Multiple Vitamin (MULITIVITAMIN WITH MINERALS) TABS Take 1 tablet by mouth daily.      . sertraline (ZOLOFT) 100 MG tablet Take 100 mg by mouth daily.       . benzonatate (TESSALON PERLES) 100 MG capsule Take 1 capsule (100 mg total) by mouth 3 (three) times daily as needed for cough.  20 capsule  0  . HYDROcodone-homatropine (HYCODAN) 5-1.5 MG/5ML syrup Take 5 mLs by mouth every 4 (four) hours as needed for cough.  120 mL  0   No current facility-administered medications for this visit.    ROS: Pertinent items are noted in HPI.  Social Hx:  Single.  Works as a Research scientist (medical).  Exam:    BP 120/84  Ht 5' 2.5" (1.588 m)  Wt 256 lb (116.121 kg)  BMI 46.05 kg/m2  LMP 10/06/2012   Wt Readings from Last 3 Encounters:  10/20/12 256 lb (116.121 kg)  07/16/12 246 lb (111.585 kg)  05/01/12 235 lb (106.595 kg)     Ht Readings from Last 3 Encounters:  10/20/12 5' 2.5" (1.588 m)  07/16/12 5' 3.5" (1.613 m)  05/01/12 5' 3.5" (1.613 m)    General appearance: alert, cooperative and appears stated age Head: Normocephalic, without obvious abnormality, atraumatic Neck: no adenopathy, supple, symmetrical, trachea midline and thyroid not enlarged, symmetric, no tenderness/mass/nodules Lungs: clear to auscultation bilaterally Breasts: Inspection negative, No nipple retraction or dimpling, No nipple discharge or bleeding, No axillary or supraclavicular adenopathy, Normal to palpation without dominant masses Heart: regular rate and rhythm Abdomen: soft, non-tender; bowel sounds normal; no  masses,  no organomegaly Extremities: extremities normal, atraumatic, no cyanosis or edema Skin: Skin color, texture, turgor normal. No rashes or lesions Lymph nodes: Cervical, supraclavicular, and axillary nodes normal. No abnormal inguinal nodes palpated Neurologic: Grossly normal   Pelvic: External genitalia:  no lesions              Urethra:  normal appearing urethra with no masses, tenderness or lesions              Bartholins and Skenes: normal                 Vagina: normal appearing vagina with normal color and discharge, no lesions              Cervix: normal appearance              Pap taken: yes and high risk HPV testing.          Bimanual Exam:  Uterus:  uterus is normal size, shape, consistency and nontender                                      Adnexa: normal adnexa in size, nontender and no masses                                      Rectovaginal: Confirms                                      Anus:  normal sphincter tone, no lesions  A: normal gynecologic exam Need for STD testing. Menorrhagia and dysmenorrhea. History of HSV. PMS symptoms. Nipple discharge. Concern about ovarian cancer. Obesity. Fatigue.  High hemoglobin level. Social smoker.     P: mammogram age 83. Encouraged self breast exam. pap smear and high risk HPV testing. STD testing. Encouraged safe sex.   Start Yaz.  Discussed concerns of potential thromboembolic events.  Discussed benefits of OCP use including reduction of risk of ovarian cancer. Patient will follow up with PCP regarding anxiety. Discussed ovarian cancer and written information to patient about this. Discussed weight loss and smoking cessation as way to reduce future risk of cardiovascular disease, HTN, and diabetes. Will do lipid profile, CMP, TSH, CBC, prolactin. Stop iron supplementation. return annually or prn     An After Visit Summary was printed  and given to the patient.

## 2012-10-20 NOTE — Patient Instructions (Addendum)
EXERCISE AND DIET:  We recommended that you start or continue a regular exercise program for good health. Regular exercise means any activity that makes your heart beat faster and makes you sweat.  We recommend exercising at least 30 minutes per day at least 3 days a week, preferably 4 or 5.  We also recommend a diet low in fat and sugar.  Inactivity, poor dietary choices and obesity can cause diabetes, heart attack, stroke, and kidney damage, among others.    ALCOHOL AND SMOKING:  Women should limit their alcohol intake to no more than 7 drinks/beers/glasses of wine (combined, not each!) per week. Moderation of alcohol intake to this level decreases your risk of breast cancer and liver damage. And of course, no recreational drugs are part of a healthy lifestyle.  And absolutely no smoking or even second hand smoke. Most people know smoking can cause heart and lung diseases, but did you know it also contributes to weakening of your bones? Aging of your skin?  Yellowing of your teeth and nails?  CALCIUM AND VITAMIN D:  Adequate intake of calcium and Vitamin D are recommended.  The recommendations for exact amounts of these supplements seem to change often, but generally speaking 600 mg of calcium (either carbonate or citrate) and 800 units of Vitamin D per day seems prudent. Certain women may benefit from higher intake of Vitamin D.  If you are among these women, your doctor will have told you during your visit.    PAP SMEARS:  Pap smears, to check for cervical cancer or precancers,  have traditionally been done yearly, although recent scientific advances have shown that most women can have pap smears less often.  However, every woman still should have a physical exam from her gynecologist every year. It will include a breast check, inspection of the vulva and vagina to check for abnormal growths or skin changes, a visual exam of the cervix, and then an exam to evaluate the size and shape of the uterus and  ovaries.  And after 27 years of age, a rectal exam is indicated to check for rectal cancers. We will also provide age appropriate advice regarding health maintenance, like when you should have certain vaccines, screening for sexually transmitted diseases, bone density testing, colonoscopy, mammograms, etc.   MAMMOGRAMS:  All women over 40 years old should have a yearly mammogram. Many facilities now offer a "3D" mammogram, which may cost around $50 extra out of pocket. If possible,  we recommend you accept the option to have the 3D mammogram performed.  It both reduces the number of women who will be called back for extra views which then turn out to be normal, and it is better than the routine mammogram at detecting truly abnormal areas.    COLONOSCOPY:  Colonoscopy to screen for colon cancer is recommended for all women at age 50.  We know, you hate the idea of the prep.  We agree, BUT, having colon cancer and not knowing it is worse!!  Colon cancer so often starts as a polyp that can be seen and removed at colonscopy, which can quite literally save your life!  And if your first colonoscopy is normal and you have no family history of colon cancer, most women don't have to have it again for 10 years.  Once every ten years, you can do something that may end up saving your life, right?  We will be happy to help you get it scheduled when you are ready.    Be sure to check your insurance coverage so you understand how much it will cost.  It may be covered as a preventative service at no cost, but you should check your particular policy.    Ovarian Cancer Ovarian cancer is cancer in the ovaries. The ovaries produce eggs in females. Most ovarian cancer is seen in women between the ages of 48 to 74. It happens more often in white women. Most patients do not have problems early in the disease. If this type of cancer is found in its later stages, survival is not as good.  CAUSES  There are no known causes of ovarian  cancer. However, below are risk factors for ovarian cancer:   Aging.  [place] or [place] descent.  Personal or family history of endometrial, colon, or breast cancer and a family history of ovarian cancer.  Women with the BRCA 1 and BRCA 2 genes are at a very high risk of getting ovarian cancer.  The use of fertility medications may increase the risk of getting ovarian cancer.  Late menopause (after age 73).  Women who became pregnant for the first time at age 25 or older. Having these risk factors does not mean you will get ovarian cancer. But you should know about them and report any that you have to your caregiver. Also, a woman with none of the risk factors can still get ovarian cancer. SYMPTOMS  There may be no symptoms in some cases. Early ovarian cancer symptoms usually are minor and resemble other health problems. The following are symptoms that may be important to diagnosing cancer of the ovary:  Unexplained weight loss.  Increase abdominal size.  Pain in the abdomen.  Pain and pressure in the back and pelvis.  Indigestion, increased gas and bloating.  Abnormal vaginal bleeding.  Loss of appetite.  Frequent urination.  Painful sexual intercourse.  Tiredness. DIAGNOSIS   During an exam, an abnormal pelvis mass is found.  An ultrasound may be done.  X-ray, CT scan or MRI may be done.  Blood tests are taken. TREATMENT  All women with ovarian cancer should have careful and precise staging performed before treatment for the best outcome. In Stage I ovarian cancer, the option of keeping the uterus and the normal ovary should be given. All treatment options should be discussed with the patient before any treatment is started. Types of treatment include:  Surgery to remove one or both ovaries. The surgery should be done by a doctor who is an expert in ovarian cancer. He/she should be well trained and have experience with gynecology oncology (cancer  surgery).  Removal of both ovaries, both fallopian tubes and uterus. This can be done with or without removing the surrounding lymph nodes.  Chemotherapy. This therapy should be discussed with specialists in those fields.  Radiation therapy. This therapy should be discussed with specialists in those fields.  A combination of surgery, chemotherapy and radiation therapy may be recommended.  After treatment, your caregivers may want to do a "second look" operation. This is to see if the cancer is coming back and if extra treatment is needed. PREVENTION & SCREENING RECOMMENDATIONS  There is no way of preventing ovarian cancer. Regular exams do not improve early diagnosis and treatment success.  Ovary removal and tubal sterilization reduce the risk of ovarian cancer. But even removal of both ovaries may not be completely effective in preventing it.  Other protective factors include:  Having more than one full-term pregnancy.  Taking birth  control pills.  Breastfeeding.  Having a tubal ligation. All of these factors reduce continual ovulation.  BRCA 1 and BRCA 2 blood test screening may be helpful for women with a strong family history for breast, ovary and colon cancer.  Caregivers should take a thorough family history regarding breast, colon, ovarian and other cancers. Women at high risk should be counseled about the benefits and risks of ovarian cancer screening. Rectovaginal pelvic examinations should be done every year.  No data has shown that screening high-risk women reduces their mortality from ovarian cancer. Still, the following are recommended in these women until childbearing is completed, or at least by age 56:  Yearly rectovaginal pelvic examinations.  CA-125 determinations (not reliable if normal and can be elevated with other noncancerous conditions such as fibroid tumors, endometriosis, pelvic infections, pregnancy and even during a menstrual period).  Transvaginal  ultrasonography. Then preventative removal of both ovaries (prophylactic bilateral oophorectomy) is recommended. A small risk of developing peritoneal carcinomatosis remains following prophylactic oophorectomy.  A Pap test does not help diagnose ovarian cancer. But it is helpful in detecting cervical cancer.  New screening tests are being studied to try to help detect ovarian cancer in its early stages. HOME CARE INSTRUCTIONS   Inform your caregiver if you have had or a family member has had cancer.  Follow the advice of the specialists treating you.  Get a yearly physical and gynecology exams. This includes a rectovaginal pelvic exam, especially for women 74 years old and older even after you had treatment. SEEK MEDICAL CARE IF:   You have any of the symptoms listed above that have not gone away after treating them for a week.  You have back or pelvic pressure or pain.  You have pain during sexual intercourse.  You are losing weight for no known reason.  Your belly (abdomen) is getting bigger.  You have abnormal vaginal bleeding. Document Released: 04/14/2004 Document Revised: 08/17/2011 Document Reviewed: 12/22/2007 [facility] Patient Information 2013 [place], [place].

## 2012-10-21 ENCOUNTER — Telehealth: Payer: Self-pay

## 2012-10-21 LAB — STD PANEL
HIV: NONREACTIVE
Hepatitis B Surface Ag: NEGATIVE

## 2012-10-21 LAB — HEMOGLOBIN, FINGERSTICK: Hemoglobin, fingerstick: 15 g/dL (ref 12.0–16.0)

## 2012-10-21 LAB — THYROID PROFILE - CHCC: Free Thyroxine Index: 3.1 (ref 1.0–3.9)

## 2012-10-21 LAB — PROLACTIN: Prolactin: 14.8 ng/mL

## 2012-10-21 LAB — TSH: TSH: 5.022 u[IU]/mL — ABNORMAL HIGH (ref 0.350–4.500)

## 2012-10-21 LAB — HEPATITIS C ANTIBODY: HCV Ab: NEGATIVE

## 2012-10-21 NOTE — Telephone Encounter (Signed)
LMOVM to call for test results. 

## 2012-10-21 NOTE — Telephone Encounter (Signed)
Message copied by Alphonsa Overall on Fri Oct 21, 2012  9:30 AM ------      Message from: Conley Simmonds      Created: Fri Oct 21, 2012  8:42 AM       Marchelle Folks,            Some results are back for [facility].  You can contact her about preliminary results.  Her cholesterol panel puts her at just a little above average for cardiovascular risk  We talked at her visit about weight loss which will help to correct this.  Her TSH was a little high, so I will add a thyroid panel on to her blood already drawn.  Pap and GC/CT are pending.            Morrie Sheldon,            Please add a thyroid panel to the blood you already have.  The TSH is already done.            Thank you both,            Conley Simmonds             ------

## 2012-10-24 ENCOUNTER — Other Ambulatory Visit: Payer: Self-pay | Admitting: Obstetrics and Gynecology

## 2012-10-24 DIAGNOSIS — R946 Abnormal results of thyroid function studies: Secondary | ICD-10-CM

## 2012-10-26 NOTE — Telephone Encounter (Signed)
Patient notified of results in my chart.

## 2013-03-02 NOTE — Progress Notes (Signed)
Please contact patient to inform her she needs to come in for a blood work to recheck her thyroid function.  Her TSH was elevated at her last blood draw.

## 2013-03-06 ENCOUNTER — Telehealth: Payer: Self-pay | Admitting: Emergency Medicine

## 2013-03-06 NOTE — Telephone Encounter (Signed)
Message left to return call to nurse at [phone].   Please advise of message below from Dr. Edward Jolly.

## 2013-03-06 NOTE — Telephone Encounter (Signed)
Message copied by Joeseph Amor on Mon Mar 06, 2013 11:06 AM ------      Message from: Conley Simmonds      Created: Mon Mar 06, 2013 10:36 AM      Regarding: please contact patient to have her return for repeat thyroid studies       Please contact patient to have her thyroid function recheck.            She is overdue for this.            Thanks,            Conley Simmonds.      ----- Message -----         From: SYSTEM         Sent: 03/01/2013  12:08 AM           To: Melony Overly, MD                   ------

## 2013-03-13 ENCOUNTER — Telehealth: Payer: Self-pay

## 2013-03-13 NOTE — Telephone Encounter (Signed)
LMOVM to call.  Pt. Needs to make appt. To recheck thyroid studies.

## 2013-03-13 NOTE — Telephone Encounter (Signed)
Message copied by Alphonsa Overall on Mon Mar 13, 2013 10:43 AM ------      Message from: Conley Simmonds      Created: Thu Mar 02, 2013 12:12 PM       Please inform patient she needs to come in for blood work for a thyroid recheck.       Her TSH was elevated with her last visit.             Thanks,             Conley Simmonds      ----- Message -----         From: SYSTEM         Sent: 03/01/2013  12:08 AM           To: Melony Overly, MD                   ------

## 2013-03-13 NOTE — Telephone Encounter (Signed)
LMOVM to call to make appt. For repeat TSH.

## 2013-03-13 NOTE — Telephone Encounter (Signed)
Patient is retuning Renee Stafford's call

## 2013-03-14 ENCOUNTER — Encounter: Payer: Self-pay | Admitting: Obstetrics and Gynecology

## 2013-03-15 NOTE — Telephone Encounter (Signed)
Unable to reach patient by phone.  Dr. Edward Jolly sent letter to patient to call to make lab appointment to check TSH.

## 2013-03-16 NOTE — Telephone Encounter (Signed)
Letter was sent by Dr. Edward Jolly to remind patient. See telephone encounter dated 03/13/13

## 2013-03-23 NOTE — Telephone Encounter (Signed)
Dr. Edward Jolly, patient was sent note last week to return to office to recheck thyroid studies (TSH).  Patient has not responded to letter or our Phone calls.  What is next step?

## 2013-03-28 NOTE — Telephone Encounter (Signed)
Renee Stafford,  Can you assist with this patient who has not responded to calls or letter regarding need for follow up of her thyroid function?  Thank you!

## 2013-04-13 ENCOUNTER — Other Ambulatory Visit: Payer: Self-pay

## 2013-04-20 ENCOUNTER — Other Ambulatory Visit (INDEPENDENT_AMBULATORY_CARE_PROVIDER_SITE_OTHER): Payer: BC Managed Care – PPO

## 2013-04-20 ENCOUNTER — Other Ambulatory Visit: Payer: Self-pay | Admitting: Obstetrics and Gynecology

## 2013-04-20 DIAGNOSIS — R946 Abnormal results of thyroid function studies: Secondary | ICD-10-CM

## 2013-04-20 NOTE — Telephone Encounter (Signed)
Kennon Rounds, I thought this had already been routed to you.  Can you help with this patient?  Is there a letter we can send her?  See Dr. Rica Records Note.  Thanks, Marchelle Folks

## 2013-04-21 LAB — THYROID PANEL WITH TSH
T3 Uptake: 36.6 % (ref 22.5–37.0)
T4, Total: 10.1 ug/dL (ref 5.0–12.5)
TSH: 1.933 u[IU]/mL (ref 0.350–4.500)

## 2013-04-21 NOTE — Telephone Encounter (Signed)
Letter had been mailed to patient and patient has returned for lab work.

## 2013-10-26 ENCOUNTER — Ambulatory Visit: Payer: 59 | Admitting: Obstetrics and Gynecology

## 2013-11-16 ENCOUNTER — Ambulatory Visit (INDEPENDENT_AMBULATORY_CARE_PROVIDER_SITE_OTHER): Payer: 59 | Admitting: Obstetrics and Gynecology

## 2013-11-16 ENCOUNTER — Encounter: Payer: Self-pay | Admitting: Obstetrics and Gynecology

## 2013-11-16 VITALS — BP 120/80 | HR 62 | Resp 16 | Ht 63.0 in | Wt 251.6 lb

## 2013-11-16 DIAGNOSIS — F32A Depression, unspecified: Secondary | ICD-10-CM

## 2013-11-16 DIAGNOSIS — Z113 Encounter for screening for infections with a predominantly sexual mode of transmission: Secondary | ICD-10-CM

## 2013-11-16 DIAGNOSIS — F3289 Other specified depressive episodes: Secondary | ICD-10-CM

## 2013-11-16 DIAGNOSIS — F329 Major depressive disorder, single episode, unspecified: Secondary | ICD-10-CM

## 2013-11-16 DIAGNOSIS — F411 Generalized anxiety disorder: Secondary | ICD-10-CM

## 2013-11-16 DIAGNOSIS — Z01419 Encounter for gynecological examination (general) (routine) without abnormal findings: Secondary | ICD-10-CM

## 2013-11-16 DIAGNOSIS — Z0000 Encounter for general adult medical examination without abnormal findings: Secondary | ICD-10-CM

## 2013-11-16 LAB — POCT URINALYSIS DIPSTICK

## 2013-11-16 MED ORDER — DROSPIRENONE-ETHINYL ESTRADIOL 3-0.02 MG PO TABS
1.0000 | ORAL_TABLET | Freq: Every day | ORAL | Status: DC
Start: 2013-11-16 — End: 2014-01-18

## 2013-11-16 MED ORDER — SERTRALINE HCL 50 MG PO TABS: 50.0000 mg | ORAL_TABLET | Freq: Every day | ORAL | Status: DC

## 2013-11-16 NOTE — Progress Notes (Signed)
Patient ID: Renee Stafford, female   DOB: May 01, 1986, 28 y.o.   MRN: [id number] GYNECOLOGY VISIT  PCP:  Harlan Stains, MD  Referring provider:   HPI: 28 y.o.   Single  Caucasian  female   G0P0 with Patient's last menstrual period was 11/15/2013.   here for  AEX   Menses fine in general.  Menses last 7 days. Heavy first few days.  Not staining clothing.  Painful menses yesterday.  Took ibuprofen helped.  Not usually painful.  Did not take Yaz prescribed last year.  Would like to try pills this year.  Took OCPs in the past without problem, just noticed decreased libido.   Depression and anxiety currently.  Patient asking for assistance for this.  Saw a psychiatrist in the past and does not want to return there.  Took Zoloft in the past and it worked well.  Ran out and did not return there.  Has bad self image.  This is the source of her anxiety and depression.  This is a chronic issue for the patient.  Does not want to currently see a therapist.  No suicidal thoughts or behaviors.   Would like to do STD testing.   Engaged to be married in one year.   Urine: unable to read d/t blood in urine (see LMP)   GYNECOLOGIC HISTORY: Patient's last menstrual period was 11/15/2013. 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  GYN procedures and prior surgeries: no  Last mammogram:   n/a              Last pap and high risk HPV testing:   10-20-12 wnl:neg HR HPV History of abnormal pap smear:  no   OB History   Grav Para Term Preterm Abortions TAB SAB Ect Mult Living   0                LIFESTYLE: Exercise:  no              Tobacco:  no Alcohol:   1 drink per week Drug use:  no  OTHER HEALTH MAINTENANCE: Tetanus/TDap:  2009 Gardisil:             no      Influenza:           never Zostavax:            n/a  Bone density:   n/a Colonoscopy:  n/a  Cholesterol check: 10/2012 -- Total chol. 171, HDL 38, LDL 104  Family History  Problem Relation Age of Onset  . Hypertension Father   . Hyperlipidemia Father   . Diabetes Maternal Grandmother     There are no active problems to display for this patient.  Past Medical History  Diagnosis Date  . Anemia   . Vitamin D deficiency   . Allergy   . Depression   . Anxiety   . Dysmenorrhea   . Dyspareunia   . STD (sexually transmitted disease)     Herpes    Past Surgical History  Procedure Laterality Date  . Tonsillectomy      ALLERGIES: Cholestatin  No current outpatient prescriptions on file.   No current facility-administered medications for this visit.     ROS:  Pertinent items are noted in HPI.  SOCIAL HISTORY:  Dog groomer.  PHYSICAL EXAMINATION:  BP 120/80  Pulse 62  Resp 16  Ht 5\' 3"  (1.6 m)  Wt 251 lb 9.6 oz (114.125 kg)  BMI 44.58 kg/m2  LMP 11/15/2013   Wt Readings from Last 3 Encounters:  11/16/13 251 lb 9.6 oz (114.125 kg)  10/20/12 256 lb (116.121 kg)  07/16/12 246 lb (111.585 kg)     Ht Readings from Last 3 Encounters:  11/16/13 5\' 3"  (1.6 m)  10/20/12 5' 2.5" (1.588 m)  07/16/12 5' 3.5" (1.613 m)    General appearance: alert, cooperative and appears stated age Head: Normocephalic, without obvious abnormality, atraumatic Neck: no adenopathy, supple, symmetrical, trachea midline and thyroid not enlarged, symmetric, no tenderness/mass/nodules Lungs: clear to auscultation bilaterally Breasts: Inspection negative, No nipple retraction or dimpling, No nipple discharge or bleeding, No axillary or supraclavicular adenopathy, Normal to palpation without dominant masses Heart: regular rate and rhythm Abdomen: soft, non-tender; no masses,  no organomegaly Extremities: extremities normal, atraumatic, no cyanosis or edema Skin: Skin color, texture, turgor normal. No rashes or lesions Lymph nodes:  Cervical, supraclavicular, and axillary nodes normal. No abnormal inguinal nodes palpated Neurologic: Grossly normal  Pelvic: External genitalia:  no lesions              Urethra:  normal appearing urethra with no masses, tenderness or lesions              Bartholins and Skenes: normal                 Vagina: normal appearing vagina with normal color and discharge, no lesions              Cervix: normal appearance.  Moderate blood flow.               Pap and high risk HPV testing done: no.            Bimanual Exam:  Uterus:  uterus is normal size, shape, consistency and nontender                                      Adnexa: normal adnexa in size, nontender and no masses                                      Rectovaginal: Confirms                                      Anus:  normal sphincter tone, no lesions  ASSESSMENT  Normal gynecologic exam. Depression and anxiety.  Chronic.  PLAN  Pap smear and high risk HPV testing not indicated.  STD testing today.  Yaz 3 packs and 3 refills.  Instruction and risks discussed DVT, PE, MI, stroke.  Counseled on self breast exam,  Exercise. Start Zoloft 50 mg daily.  #30, RF 5.  I explained that she will need to come in for a recheck in order for me to treat her depression and anxiety. I explained that if she needed additional medication such as Xanax, she would need to see psychiatrist.  Return annually or prn   An After Visit Summary was printed and given to the patient.  An additional 15 minutes spent discussing anxiety and depression, of which over 50% was spent in counseling.

## 2013-11-16 NOTE — Patient Instructions (Signed)

## 2013-11-17 LAB — HEPATITIS C ANTIBODY: HCV Ab: NEGATIVE

## 2013-11-17 LAB — GC/CHLAMYDIA PROBE AMP, URINE
Chlamydia, Swab/Urine, PCR: NEGATIVE
GC Probe Amp, Urine: NEGATIVE

## 2013-11-17 LAB — STD PANEL
HEP B S AG: NEGATIVE
HIV: NONREACTIVE

## 2014-01-17 ENCOUNTER — Telehealth: Payer: Self-pay | Admitting: Obstetrics and Gynecology

## 2014-01-17 ENCOUNTER — Ambulatory Visit: Payer: 59 | Admitting: Obstetrics and Gynecology

## 2014-01-17 NOTE — Telephone Encounter (Signed)
Thanks for rescheduling the patient.  Encounter closed.

## 2014-01-17 NOTE — Telephone Encounter (Signed)
Patient [facility] office visit for today at 12:30 called and got her rescheduled for tomorrow at 2:30. pt charged

## 2014-01-18 ENCOUNTER — Encounter: Payer: Self-pay | Admitting: Obstetrics and Gynecology

## 2014-01-18 ENCOUNTER — Ambulatory Visit (INDEPENDENT_AMBULATORY_CARE_PROVIDER_SITE_OTHER): Payer: 59 | Admitting: Obstetrics and Gynecology

## 2014-01-18 VITALS — BP 120/76 | HR 80 | Resp 14 | Ht 63.0 in | Wt 253.6 lb

## 2014-01-18 DIAGNOSIS — F411 Generalized anxiety disorder: Secondary | ICD-10-CM

## 2014-01-18 DIAGNOSIS — N920 Excessive and frequent menstruation with regular cycle: Secondary | ICD-10-CM

## 2014-01-18 DIAGNOSIS — N946 Dysmenorrhea, unspecified: Secondary | ICD-10-CM

## 2014-01-18 MED ORDER — NORETHINDRONE 0.35 MG PO TABS: 1.0000 | ORAL_TABLET | Freq: Every day | ORAL | Status: DC

## 2014-01-18 MED ORDER — SERTRALINE HCL 50 MG PO TABS
50.0000 mg | ORAL_TABLET | Freq: Every day | ORAL | Status: DC
Start: 2014-01-18 — End: 2015-07-04

## 2014-01-18 NOTE — Progress Notes (Signed)
Patient ID: Renee Stafford, female   DOB: 1985/09/13, 28 y.o.   MRN: [id number] GYNECOLOGY  VISIT   HPI: 28 y.o.   Single  Caucasian  female   G0P0 with Patient's last menstrual period was 01/06/2014.   here for   3 month follow up after beginning Zoloft.  Patient states feeling much better with Zoloft. Zoloft is treating depression and anxiety.  Taking Zoloft for 1 1/2 - 2 months.  Less anxiety and paranoia now.  Talking more with co-workers.  Less social anxiety.  No depression.  No suicidal or homicidal ideation.   Started Yaz for painful and heavy menses. Had nausea with Yaz so she stopped this.  Had constant sour stomach with them.  Took at night and still did not help. Not always using condoms.  Getting married next year.   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   GYNECOLOGIC HISTORY: Patient's last menstrual period was 01/06/2014. Contraception:   OCP's--Yaz Menopausal hormone therapy: n/a        OB History   Grav Para Term Preterm Abortions TAB SAB Ect Mult Living   0                  There are no active problems to display for this patient.   Past Medical History  Diagnosis Date  . Anemia   . Vitamin D deficiency   . Allergy   . Depression   . Anxiety   . Dysmenorrhea   . Dyspareunia   . STD (sexually transmitted disease)     Herpes    Past Surgical History  Procedure Laterality Date  . Tonsillectomy      Current Outpatient Prescriptions  Medication Sig Dispense Refill  . drospirenone-ethinyl estradiol (YAZ,GIANVI,LORYNA) 3-0.02 MG tablet Take 1 tablet by mouth daily.  3 Package  3  . sertraline (ZOLOFT) 50 MG tablet Take 1 tablet (50 mg total) by mouth daily.  90 tablet  1   No current facility-administered medications for this visit.     ALLERGIES: Cholestatin  Family History  Problem Relation Age of Onset  . Hypertension Father   . Hyperlipidemia Father   . Diabetes Maternal Grandmother   . Breast cancer Maternal Aunt 50    breast  cancer    History   Social History  . Marital Status: Single    Spouse Name: N/A    Number of Children: N/A  . Years of Education: N/A   Occupational History  . Not on file.   Social History Main Topics  . Smoking status: Never Smoker   . Smokeless tobacco: Not on file  . Alcohol Use: 0.5 oz/week    1 drink(s) per week     Comment: ocassional  . Drug Use: No  . Sexual Activity: Yes    Partners: Male    Birth Control/ Protection: OCP   Other Topics Concern  . Not on file   Social History Narrative  . No narrative on file    ROS:  Pertinent items are noted in HPI.  PHYSICAL EXAMINATION:    BP 120/76  Pulse 80  Resp 14  Ht 5\' 3"  (1.6 m)  Wt 253 lb 9.6 oz (115.032 kg)  BMI 44.93 kg/m2  LMP 01/06/2014     General appearance: alert, cooperative and appears stated age  ASSESSMENT  Social anxiety - improved with Zoloft.  Dysmenorrhea and menorrhagia.  Intolerance to Yaz - nausea and abdominal pain.  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  PLAN  Continue Zoloft 50 mg daily.   See orders. Discussed options for birth control - Micronor, Depo Provera, Nexplanon, Skyla, ParaGard, Diaphragm.   Patient chooses Micronor.  Instructed in use.  See orders. Take PNV if decides to stop OCPs. Counseled in possible allergic reaction to Micronor.  Benadryl and seek medical attention if occurs.  Return for annual exam and prn.    An After Visit Summary was printed and given to the patient.  ___25___ minutes face to face time of which over 50% was spent in counseling.

## 2014-02-02 ENCOUNTER — Encounter: Payer: Self-pay | Admitting: Obstetrics and Gynecology

## 2014-04-22 ENCOUNTER — Emergency Department (HOSPITAL_COMMUNITY)
Admission: EM | Admit: 2014-04-22 | Discharge: 2014-04-22 | Disposition: A | Discharge status: Home / Self Care | Payer: 59 | Source: Home / Self Care | Attending: Emergency Medicine | Admitting: Emergency Medicine

## 2014-04-22 ENCOUNTER — Encounter (HOSPITAL_COMMUNITY): Payer: Self-pay | Admitting: *Deleted

## 2014-04-22 DIAGNOSIS — Z9109 Other allergy status, other than to drugs and biological substances: Secondary | ICD-10-CM

## 2014-04-22 DIAGNOSIS — Z91048 Other nonmedicinal substance allergy status: Secondary | ICD-10-CM

## 2014-04-22 MED ORDER — IPRATROPIUM BROMIDE 0.06 % NA SOLN: 2.0000 | Freq: Four times a day (QID) | NASAL | Status: DC | PRN

## 2014-04-22 MED ORDER — CETIRIZINE HCL 10 MG PO TABS: 10.0000 mg | ORAL_TABLET | Freq: Every day | ORAL | Status: DC

## 2014-04-22 MED ORDER — FLUTICASONE PROPIONATE 50 MCG/ACT NA SUSP: 1.0000 | Freq: Every day | NASAL | Status: AC

## 2014-04-22 NOTE — ED Notes (Signed)
C/o sneezing and nasal congestion when she lays down at night for 1 1/2 weeks.  Occ. Cough from post nasal drip. C/o sinus headache this AM. Renee Stafford and sudafed without relief.

## 2014-04-22 NOTE — Discharge Instructions (Signed)
You have allergic rhinitis. Switch your allergy pill to Zyrtec. Start flonase daily. Use atrovent 4 times a day as needed for congestion. You can also use Afrin nasal spray.  Do NOT use more than 3 days.  Follow up with PCP as needed.

## 2014-04-22 NOTE — ED Provider Notes (Signed)
CSN: [id number]     Arrival date & time 04/22/14  1600 History   First MD Initiated Contact with Patient 04/22/14 1611     Chief Complaint  Patient presents with  . Allergies   (Consider location/radiation/quality/duration/timing/severity/associated sxs/prior Treatment) HPI  She is a 28 year old woman here for evaluation of allergies. She states for the last 10 days or so she has had nasal congestion, postnasal drip, and sneezing primarily at night. 3 weeks ago, she had similar symptoms after exposure to her mom's cats. Her symptoms resolved, then she has been cleaning out her father-in-law's house which has flared up her symptoms again. She's been taking Allegra and Sudafed without relief. No fevers or chills. She does report a mild cough that she attributes to postnasal drip.  Past Medical History  Diagnosis Date  . Anemia   . Vitamin D deficiency   . Allergy   . Depression   . Anxiety   . Dysmenorrhea   . Dyspareunia   . STD (sexually transmitted disease)     Herpes   Past Surgical History  Procedure Laterality Date  . Tonsillectomy  1991   Family History  Problem Relation Age of Onset  . Hypertension Father   . Hyperlipidemia Father   . Diabetes Maternal Grandmother   . Breast cancer Maternal Aunt 50    breast cancer   History  Substance Use Topics  . Smoking status: Never Smoker   . Smokeless tobacco: Not on file  . Alcohol Use: 0.5 oz/week    1 Not specified per week     Comment: ocassional   OB History    Gravida Para Term Preterm AB TAB SAB Ectopic Multiple Living   0              Review of Systems  Constitutional: Negative for fever.  HENT: Positive for congestion, postnasal drip and sneezing.   Respiratory: Positive for cough. Negative for shortness of breath.     Allergies  Cholestatin  Home Medications   Prior to Admission medications   Medication Sig Start Date End Date Taking? Authorizing Provider  sertraline (ZOLOFT) 50 MG tablet Take 1  tablet (50 mg total) by mouth daily. 01/18/14  Yes Tekoa Berton Lan, MD  cetirizine (ZYRTEC) 10 MG tablet Take 1 tablet (10 mg total) by mouth daily. 04/22/14   Melony Overly, MD  fluticasone (FLONASE) 50 MCG/ACT nasal spray Place 1 spray into both nostrils daily. 04/22/14   Melony Overly, MD  ipratropium (ATROVENT) 0.06 % nasal spray Place 2 sprays into both nostrils 4 (four) times daily as needed for rhinitis. 04/22/14   Melony Overly, MD  norethindrone (MICRONOR,CAMILA,ERRIN) 0.35 MG tablet Take 1 tablet (0.35 mg total) by mouth daily. 01/18/14   [place], MD   BP 136/86 mmHg  Pulse 72  Temp(Src) 98.6 F (37 C) (Oral)  Resp 18  SpO2 99%  LMP 04/08/2014 Physical Exam  Constitutional: She is oriented to person, place, and time. She appears well-developed and well-nourished. No distress.  HENT:  Head: Normocephalic and atraumatic.  Right Ear: External ear normal.  Left Ear: External ear normal.  Nose: Mucosal edema present.  Mouth/Throat: Oropharynx is clear and moist. No oropharyngeal exudate.  Neck: Neck supple.  Cardiovascular: Normal rate, regular rhythm and normal heart sounds.   No murmur heard. Pulmonary/Chest: Effort normal and breath sounds normal. No respiratory distress. She has no wheezes. She has no rales.  Lymphadenopathy:    She has no cervical adenopathy.  Neurological: She is alert and oriented to person, place, and time.    ED Course  Procedures (including critical care time) Labs Review Labs Reviewed - No data to display  Imaging Review No results found.   MDM   1. Environmental allergies    Symptoms consistent with allergies. Will change allergy pill to Zyrtec 10 mg daily. Also recommended starting Flonase daily. Prescribed Atrovent to use as needed while Flonase takes effect. Also discussed Afrin use and limiting it to 3 days. Follow-up with PCP as needed.    Melony Overly, MD 04/22/14 [phone]

## 2014-05-09 ENCOUNTER — Emergency Department (HOSPITAL_COMMUNITY)
Admission: EM | Admit: 2014-05-09 | Discharge: 2014-05-09 | Disposition: A | Discharge status: Home / Self Care | Payer: 59 | Source: Home / Self Care | Attending: Family Medicine | Admitting: Family Medicine

## 2014-05-09 ENCOUNTER — Encounter (HOSPITAL_COMMUNITY): Payer: Self-pay | Admitting: Emergency Medicine

## 2014-05-09 DIAGNOSIS — J40 Bronchitis, not specified as acute or chronic: Secondary | ICD-10-CM

## 2014-05-09 MED ORDER — PREDNISONE 10 MG PO TABS: 30.0000 mg | ORAL_TABLET | Freq: Every day | ORAL | Status: DC

## 2014-05-09 MED ORDER — IPRATROPIUM-ALBUTEROL 0.5-2.5 (3) MG/3ML IN SOLN
RESPIRATORY_TRACT | Status: AC
  Filled 2014-05-09: qty 3

## 2014-05-09 MED ORDER — IPRATROPIUM-ALBUTEROL 0.5-2.5 (3) MG/3ML IN SOLN
3.0000 mL | Freq: Once | RESPIRATORY_TRACT | Status: AC
  Administered 2014-05-09: 3 mL via RESPIRATORY_TRACT

## 2014-05-09 MED ORDER — ALBUTEROL SULFATE HFA 108 (90 BASE) MCG/ACT IN AERS: 2.0000 | INHALATION_SPRAY | Freq: Four times a day (QID) | RESPIRATORY_TRACT | Status: DC | PRN

## 2014-05-09 NOTE — ED Provider Notes (Signed)
Renee Stafford is a 28 y.o. female who presents to Urgent Care today for cough. Patient has a one-week history of productive cough runny nose and nasal congestion. She notes mild shortness of breath chest tightness and wheezing. The cough is bothersome and interfering with sleep. She has a mild asthma history. She's tried Mucinex which has not helped. No fevers or chills vomiting or diarrhea.   Past Medical History  Diagnosis Date  . Anemia   . Vitamin D deficiency   . Allergy   . Depression   . Anxiety   . Dysmenorrhea   . Dyspareunia   . STD (sexually transmitted disease)     Herpes   Past Surgical History  Procedure Laterality Date  . Tonsillectomy  1991   History  Substance Use Topics  . Smoking status: Never Smoker   . Smokeless tobacco: Not on file  . Alcohol Use: 0.5 oz/week    1 Not specified per week     Comment: ocassional   ROS as above Medications: Current Facility-Administered Medications  Medication Dose Route Frequency Provider Last Rate Last Dose  . ipratropium-albuterol (DUONEB) 0.5-2.5 (3) MG/3ML nebulizer solution 3 mL  3 mL Nebulization Once Gregor Hams, MD       Current Outpatient Prescriptions  Medication Sig Dispense Refill  . sertraline (ZOLOFT) 50 MG tablet Take 1 tablet (50 mg total) by mouth daily. 90 tablet 2  . cetirizine (ZYRTEC) 10 MG tablet Take 1 tablet (10 mg total) by mouth daily. 30 tablet 0  . fluticasone (FLONASE) 50 MCG/ACT nasal spray Place 1 spray into both nostrils daily. 16 g 0  . ipratropium (ATROVENT) 0.06 % nasal spray Place 2 sprays into both nostrils 4 (four) times daily as needed for rhinitis. 15 mL 0  . norethindrone (MICRONOR,CAMILA,ERRIN) 0.35 MG tablet Take 1 tablet (0.35 mg total) by mouth daily. 1 Package 9   Allergies  Allergen Reactions  . Cholestatin     Also allergic to dust, cats, dogs, mold     Exam:  BP 133/82 mmHg  Pulse 83  Temp(Src) 98.4 F (36.9 C) (Oral)  Resp 18  SpO2 97%  LMP  04/08/2014 Gen: Well NAD HEENT: EOMI,  MMM no pharynx with cobblestoning. Normal tympanic membranes bilaterally. Lungs: Normal work of breathing. CTABL Heart: RRR no MRG Abd: NABS, Soft. Nondistended, Nontender Exts: Brisk capillary refill, warm and well perfused.   Patient was given a 2.5/0.5 mg DuoNeb nebulizer treatment, and felt better  No results found for this or any previous visit (from the past 24 hour(s)). No results found.  Assessment and Plan: 28 y.o. female with bronchitis likely due to viral cause with a component of reactive airway disease. Treatment with prednisone and albuterol. Continue home Atrovent nasal spray. Follow-up as needed.  Discussed warning signs or symptoms. Please see discharge instructions. Patient expresses understanding.     Gregor Hams, MD 05/09/14 [phone]

## 2014-05-09 NOTE — Discharge Instructions (Signed)
Thank you for coming in today. Call or go to the emergency room if you get worse, have trouble breathing, have chest pains, or palpitations.   Acute Bronchitis Bronchitis is inflammation of the airways that extend from the windpipe into the lungs (bronchi). The inflammation often causes mucus to develop. This leads to a cough, which is the most common symptom of bronchitis.  In acute bronchitis, the condition usually develops suddenly and goes away over time, usually in a couple weeks. Smoking, allergies, and asthma can make bronchitis worse. Repeated episodes of bronchitis may cause further lung problems.  CAUSES Acute bronchitis is most often caused by the same virus that causes a cold. The virus can spread from person to person (contagious) through coughing, sneezing, and touching contaminated objects. SIGNS AND SYMPTOMS  1. Cough.  2. Fever.  3. Coughing up mucus.  4. Body aches.  5. Chest congestion.  6. Chills.  7. Shortness of breath.  8. Sore throat.  DIAGNOSIS  Acute bronchitis is usually diagnosed through a physical exam. Your health care provider will also ask you questions about your medical history. Tests, such as chest X-rays, are sometimes done to rule out other conditions.  TREATMENT  Acute bronchitis usually goes away in a couple weeks. Oftentimes, no medical treatment is necessary. Medicines are sometimes given for relief of fever or cough. Antibiotic medicines are usually not needed but may be prescribed in certain situations. In some cases, an inhaler may be recommended to help reduce shortness of breath and control the cough. A cool mist vaporizer may also be used to help thin bronchial secretions and make it easier to clear the chest.  HOME CARE INSTRUCTIONS  Get plenty of rest.   Drink enough fluids to keep your urine clear or pale yellow (unless you have a medical condition that requires fluid restriction). Increasing fluids may help thin your respiratory  secretions (sputum) and reduce chest congestion, and it will prevent dehydration.   Take medicines only as directed by your health care provider.  If you were prescribed an antibiotic medicine, finish it all even if you start to feel better.  Avoid smoking and secondhand smoke. Exposure to cigarette smoke or irritating chemicals will make bronchitis worse. If you are a smoker, consider using nicotine gum or skin patches to help control withdrawal symptoms. Quitting smoking will help your lungs heal faster.   Reduce the chances of another bout of acute bronchitis by washing your hands frequently, avoiding people with cold symptoms, and trying not to touch your hands to your mouth, nose, or eyes.   Keep all follow-up visits as directed by your health care provider.  SEEK MEDICAL CARE IF: Your symptoms do not improve after 1 week of treatment.  SEEK IMMEDIATE MEDICAL CARE IF:  You develop an increased fever or chills.   You have chest pain.   You have severe shortness of breath.  You have bloody sputum.   You develop dehydration.  You faint or repeatedly feel like you are going to pass out.  You develop repeated vomiting.  You develop a severe headache. MAKE SURE YOU:   Understand these instructions.  Will watch your condition.  Will get help right away if you are not doing well or get worse. Document Released: 07/02/2004 Document Revised: 10/09/2013 Document Reviewed: 11/15/2012 [facility] Patient Information 2015 [place], [place]. This information is not intended to replace advice given to you by your health care provider. Make sure you discuss any questions you have with your  health care provider.    Metered Dose Inhaler (No Spacer Used) Inhaled medicines are the basis of treatment for asthma and other breathing problems. Inhaled medicine can only be effective if used properly. Good technique assures that the medicine reaches the lungs. Metered dose inhalers (MDIs) are  used to deliver a variety of inhaled medicines. These include quick relief or rescue medicines (such as bronchodilators) and controller medicines (such as corticosteroids). The medicine is delivered by pushing down on a metal canister to release a set amount of spray. If you are using different kinds of inhalers, use your quick relief medicine to open the airways 10-15 minutes before using a steroid, if instructed to do so by your health care provider. If you are unsure which inhalers to use and the order of using them, ask your health care provider, nurse, or respiratory therapist. HOW TO USE THE INHALER 9. Remove the cap from the inhaler. 10. If you are using the inhaler for the first time, you will need to prime it. Shake the inhaler for 5 seconds and release four puffs into the air, away from your face. Ask your health care provider or pharmacist if you have questions about priming your inhaler. 11. Shake the inhaler for 5 seconds before each breath in (inhalation). 12. Position the inhaler so that the top of the canister faces up. 13. Put your index finger on the top of the medicine canister. Your thumb supports the bottom of the inhaler. 14. Open your mouth. 15. Either place the inhaler between your teeth and place your lips tightly around the mouthpiece, or hold the inhaler 1-2 inches away from your open mouth. If you are unsure of which technique to use, ask your health care provider. 16. Breathe out (exhale) normally and as completely as possible. 17. Press the canister down with the index finger to release the medicine. 18. At the same time as the canister is pressed, inhale deeply and slowly until your lungs are completely filled. This should take 4-6 seconds. Keep your tongue down. 19. Hold the medicine in your lungs for 5-10 seconds (10 seconds is best). This helps the medicine get into the small airways of your lungs. 20. Breathe out slowly, through pursed lips. Whistling is an example of  pursed lips. 21. Wait at least 1 minute between puffs. Continue with the above steps until you have taken the number of puffs your health care provider has ordered. Do not use the inhaler more than your health care provider directs you to. 22. Replace the cap on the inhaler. 23. Follow the directions from your health care provider or the inhaler insert for cleaning the inhaler. If you are using a steroid inhaler, after your last puff, rinse your mouth with water, gargle, and spit out the water. Do not swallow the water. AVOID:  Inhaling before or after starting the spray of medicine. It takes practice to coordinate your breathing with triggering the spray.  Inhaling through the nose (rather than the mouth) when triggering the spray. HOW TO DETERMINE IF YOUR INHALER IS FULL OR NEARLY EMPTY You cannot know when an inhaler is empty by shaking it. Some inhalers are now being made with dose counters. Ask your health care provider for a prescription that has a dose counter if you feel you need that extra help. If your inhaler does not have a counter, ask your health care provider to help you determine the date you need to refill your inhaler. Write the refill date on  a calendar or your inhaler canister. Refill your inhaler 7-10 days before it runs out. Be sure to keep an adequate supply of medicine. This includes making sure it has not expired, and making sure you have a spare inhaler. SEEK MEDICAL CARE IF:  Symptoms are only partially relieved with your inhaler.  You are having trouble using your inhaler.  You experience an increase in phlegm. SEEK IMMEDIATE MEDICAL CARE IF:  You feel little or no relief with your inhalers. You are still wheezing and feeling shortness of breath, tightness in your chest, or both.  You have dizziness, headaches, or a fast heart rate.  You have chills, fever, or night sweats.  There is a noticeable increase in phlegm production, or there is blood in the  phlegm. MAKE SURE YOU:  Understand these instructions.  Will watch your condition.  Will get help right away if you are not doing well or get worse. Document Released: 03/22/2007 Document Revised: 10/09/2013 Document Reviewed: 11/10/2012 [facility] Patient Information 2015 [place], [place]. This information is not intended to replace advice given to you by your health care provider. Make sure you discuss any questions you have with your health care provider.

## 2014-05-09 NOTE — ED Notes (Signed)
C/o nasal/chest congestion x1 week Sx also include productive cough Denies f/v/n/d, SOB, wheezing Alert, no signs of acute distress.

## 2014-05-17 ENCOUNTER — Emergency Department (HOSPITAL_COMMUNITY)
Admission: EM | Admit: 2014-05-17 | Discharge: 2014-05-17 | Disposition: A | Discharge status: Home / Self Care | Payer: 59 | Source: Home / Self Care | Attending: Family Medicine | Admitting: Family Medicine

## 2014-05-17 ENCOUNTER — Encounter (HOSPITAL_COMMUNITY): Payer: Self-pay | Admitting: Emergency Medicine

## 2014-05-17 DIAGNOSIS — J40 Bronchitis, not specified as acute or chronic: Secondary | ICD-10-CM

## 2014-05-17 DIAGNOSIS — H109 Unspecified conjunctivitis: Secondary | ICD-10-CM

## 2014-05-17 MED ORDER — ERYTHROMYCIN 5 MG/GM OP OINT
TOPICAL_OINTMENT | OPHTHALMIC | Status: DC
Start: 2014-05-17 — End: 2015-07-04

## 2014-05-17 NOTE — ED Provider Notes (Signed)
CSN: [id number]     Arrival date & time 05/17/14  1328 History   First MD Initiated Contact with Patient 05/17/14 1347     Chief Complaint  Patient presents with  . Eye Problem  . URI   (Consider location/radiation/quality/duration/timing/severity/associated sxs/prior Treatment) HPI Comments: 28 year old female complaining of left eyes swelling that has developed in the past 24 hours. She is complaining of redness and a mucoid drainage. In addition she notes that she is currently being treated for bronchitis and wheezing with steroids and albuterol. Denies new symptoms or worsening.   Past Medical History  Diagnosis Date  . Anemia   . Vitamin D deficiency   . Allergy   . Depression   . Anxiety   . Dysmenorrhea   . Dyspareunia   . STD (sexually transmitted disease)     Herpes   Past Surgical History  Procedure Laterality Date  . Tonsillectomy  1991   Family History  Problem Relation Age of Onset  . Hypertension Father   . Hyperlipidemia Father   . Diabetes Maternal Grandmother   . Breast cancer Maternal Aunt 50    breast cancer   History  Substance Use Topics  . Smoking status: Never Smoker   . Smokeless tobacco: Not on file  . Alcohol Use: 0.5 oz/week    1 Not specified per week     Comment: ocassional   OB History    Gravida Para Term Preterm AB TAB SAB Ectopic Multiple Living   0              Review of Systems  Constitutional: Positive for activity change. Negative for fever and chills.  HENT: Positive for congestion. Negative for sore throat.   Eyes: Positive for discharge and redness. Negative for itching and visual disturbance.  Respiratory: Positive for cough and wheezing.   Cardiovascular: Negative.   Gastrointestinal: Negative.   Neurological: Negative.     Allergies  Cholestatin  Home Medications   Prior to Admission medications   Medication Sig Start Date End Date Taking? Authorizing Provider  albuterol (PROVENTIL HFA;VENTOLIN HFA) 108 (90  BASE) MCG/ACT inhaler Inhale 2 puffs into the lungs every 6 (six) hours as needed for wheezing or shortness of breath. 05/09/14   Gregor Hams, MD  cetirizine (ZYRTEC) 10 MG tablet Take 1 tablet (10 mg total) by mouth daily. 04/22/14   Melony Overly, MD  erythromycin ophthalmic ointment Place a 1/2 inch ribbon of ointment into both lower eyelids qid 05/17/14   Janne Napoleon, NP  fluticasone [facility]) 50 MCG/ACT nasal spray Place 1 spray into both nostrils daily. 04/22/14   Melony Overly, MD  ipratropium (ATROVENT) 0.06 % nasal spray Place 2 sprays into both nostrils 4 (four) times daily as needed for rhinitis. 04/22/14   Melony Overly, MD  predniSONE (DELTASONE) 10 MG tablet Take 3 tablets (30 mg total) by mouth daily. 05/09/14   Gregor Hams, MD  sertraline (ZOLOFT) 50 MG tablet Take 1 tablet (50 mg total) by mouth daily. 01/18/14   [place] Berton Lan, MD   BP 122/66 mmHg  Pulse 87  Temp(Src) 99.6 F (37.6 C) (Oral)  Resp 16  SpO2 100%  LMP 04/08/2014 Physical Exam  Constitutional: She is oriented to person, place, and time. She appears well-developed and well-nourished. No distress.  HENT:  Bilateral TMs are normal   Eyes: Pupils are equal, round, and reactive to light.  Bilateral contact arrival erythema left greater than  right. Mild swelling of the left upper lid. Clear mucoid drainage from the left eye, scant.  Neck: Normal range of motion. Neck supple.  Cardiovascular: Normal rate, regular rhythm and normal heart sounds.   Pulmonary/Chest: Effort normal and breath sounds normal.  By mouth and expiratory wheezes with forced expiration. Otherwise good air movement.  Musculoskeletal: Normal range of motion. She exhibits no edema.  Lymphadenopathy:    She has no cervical adenopathy.  Neurological: She is alert and oriented to person, place, and time.  Skin: Skin is warm and dry.  Psychiatric: She has a normal mood and affect.  Nursing note and vitals reviewed.   ED  Course  Procedures (including critical care time) Labs Review Labs Reviewed - No data to display  Imaging Review No results found.   MDM   1. Bilateral conjunctivitis   2. Bronchitis    Erythromycin op oint as dir.   Warm compresses Cont albuterol and prednisone Fluids     Janne Napoleon, NP 05/17/14 1407

## 2014-05-17 NOTE — ED Notes (Signed)
Renee Stafford for 2 weeks, diagnosed last week with bronchitis.  Now woke with left eye redness, drainage, not so much pain

## 2014-05-17 NOTE — Discharge Instructions (Signed)

## 2014-05-25 ENCOUNTER — Emergency Department (HOSPITAL_COMMUNITY)
Admission: EM | Admit: 2014-05-25 | Discharge: 2014-05-25 | Disposition: A | Discharge status: Home / Self Care | Payer: 59 | Source: Home / Self Care | Attending: Family Medicine | Admitting: Family Medicine

## 2014-05-25 DIAGNOSIS — J029 Acute pharyngitis, unspecified: Secondary | ICD-10-CM

## 2014-05-25 LAB — POCT RAPID STREP A: STREPTOCOCCUS, GROUP A SCREEN (DIRECT): NEGATIVE

## 2014-05-25 MED ORDER — PREDNISONE 10 MG PO TABS: 30.0000 mg | ORAL_TABLET | Freq: Every day | ORAL | Status: DC

## 2014-05-25 MED ORDER — IPRATROPIUM BROMIDE 0.06 % NA SOLN: 2.0000 | Freq: Four times a day (QID) | NASAL | Status: DC | PRN

## 2014-05-25 NOTE — ED Notes (Signed)
C/o ST onset 2 weeks; odynophagia Seen here last week for bronchitis; productive cough Denies fevers, chills Alert, no signs of acute distress.

## 2014-05-25 NOTE — Discharge Instructions (Signed)
Thank you for coming in today. Call or go to the emergency room if you get worse, have trouble breathing, have chest pains, or palpitations.   Pharyngitis Pharyngitis is redness, pain, and swelling (inflammation) of your pharynx.  CAUSES  Pharyngitis is usually caused by infection. Most of the time, these infections are from viruses (viral) and are part of a cold. However, sometimes pharyngitis is caused by bacteria (bacterial). Pharyngitis can also be caused by allergies. Viral pharyngitis may be spread from person to person by coughing, sneezing, and personal items or utensils (cups, forks, spoons, toothbrushes). Bacterial pharyngitis may be spread from person to person by more intimate contact, such as kissing.  SIGNS AND SYMPTOMS  Symptoms of pharyngitis include:   Sore throat.   Tiredness (fatigue).   Low-grade fever.   Headache.  Joint pain and muscle aches.  Skin rashes.  Swollen lymph nodes.  Plaque-like film on throat or tonsils (often seen with bacterial pharyngitis). DIAGNOSIS  Your health care provider will ask you questions about your illness and your symptoms. Your medical history, along with a physical exam, is often all that is needed to diagnose pharyngitis. Sometimes, a rapid strep test is done. Other lab tests may also be done, depending on the suspected cause.  TREATMENT  Viral pharyngitis will usually get better in 3-4 days without the use of medicine. Bacterial pharyngitis is treated with medicines that kill germs (antibiotics).  HOME CARE INSTRUCTIONS   Drink enough water and fluids to keep your urine clear or pale yellow.   Only take over-the-counter or prescription medicines as directed by your health care provider:   If you are prescribed antibiotics, make sure you finish them even if you start to feel better.   Do not take aspirin.   Get lots of rest.   Gargle with 8 oz of salt water ( tsp of salt per 1 qt of water) as often as every 1-2  hours to soothe your throat.   Throat lozenges (if you are not at risk for choking) or sprays may be used to soothe your throat. SEEK MEDICAL CARE IF:   You have large, tender lumps in your neck.  You have a rash.  You cough up green, yellow-brown, or bloody spit. SEEK IMMEDIATE MEDICAL CARE IF:   Your neck becomes stiff.  You drool or are unable to swallow liquids.  You vomit or are unable to keep medicines or liquids down.  You have severe pain that does not go away with the use of recommended medicines.  You have trouble breathing (not caused by a stuffy nose). MAKE SURE YOU:   Understand these instructions.  Will watch your condition.  Will get help right away if you are not doing well or get worse. Document Released: 05/25/2005 Document Revised: 03/15/2013 Document Reviewed: 01/30/2013 [facility] Patient Information 2015 [place], [place]. This information is not intended to replace advice given to you by your health care provider. Make sure you discuss any questions you have with your health care provider.

## 2014-05-25 NOTE — ED Provider Notes (Signed)
Renee Stafford is a 28 y.o. female who presents to Urgent Care today for sore throat. Patient is a two-week history of sore throat cough and congestion. Symptoms worsened recently. Patient was previously treated for bronchitis with prednisone  and Atrovent nasal spray. No vomiting or diarrhea. Patient has used ibuprofen which helps some.  Past Medical History  Diagnosis Date  . Anemia   . Vitamin D deficiency   . Allergy   . Depression   . Anxiety   . Dysmenorrhea   . Dyspareunia   . STD (sexually transmitted disease)     Herpes   Past Surgical History  Procedure Laterality Date  . Tonsillectomy  1991   History  Substance Use Topics  . Smoking status: Never Smoker   . Smokeless tobacco: Not on file  . Alcohol Use: 0.5 oz/week    1 Not specified per week     Comment: ocassional   ROS as above Medications: No current facility-administered medications for this encounter.   Current Outpatient Prescriptions  Medication Sig Dispense Refill  . albuterol (PROVENTIL HFA;VENTOLIN HFA) 108 (90 BASE) MCG/ACT inhaler Inhale 2 puffs into the lungs every 6 (six) hours as needed for wheezing or shortness of breath. 1 Inhaler 2  . sertraline (ZOLOFT) 50 MG tablet Take 1 tablet (50 mg total) by mouth daily. 90 tablet 2  . cetirizine (ZYRTEC) 10 MG tablet Take 1 tablet (10 mg total) by mouth daily. 30 tablet 0  . erythromycin ophthalmic ointment Place a 1/2 inch ribbon of ointment into both lower eyelids qid 1 g 0  . fluticasone (FLONASE) 50 MCG/ACT nasal spray Place 1 spray into both nostrils daily. 16 g 0  . ipratropium (ATROVENT) 0.06 % nasal spray Place 2 sprays into both nostrils 4 (four) times daily as needed for rhinitis. 15 mL 0  . predniSONE (DELTASONE) 10 MG tablet Take 3 tablets (30 mg total) by mouth daily. 15 tablet 0  . [DISCONTINUED] norethindrone (MICRONOR,CAMILA,ERRIN) 0.35 MG tablet Take 1 tablet (0.35 mg total) by mouth daily. 1 Package 9   Allergies  Allergen Reactions   . Cholestatin     Also allergic to dust, cats, dogs, mold     Exam:  BP 147/83 mmHg  Pulse 83  Temp(Src) 97.8 F (36.6 C) (Oral)  Resp 18  SpO2 100% Gen: Well NAD HEENT: EOMI,  MMM POSTERIOR PHARYNX WITH COBBLESTONING. NORMAL TYMPANIC MEMBRANES BILATERALLY  Lungs: Normal work of breathing. CTABL Heart: RRR no MRG Abd: NABS, Soft. Nondistended, Nontender Exts: Brisk capillary refill, warm and well perfused.   Results for orders placed or performed during the hospital encounter of 05/25/14 (from the past 24 hour(s))  POCT rapid strep A [facility])     Status: None   Collection Time: 05/25/14  6:11 PM  Result Value Ref Range   Streptococcus, Group A Screen (Direct) NEGATIVE NEGATIVE   No results found.  Assessment and Plan: 28 y.o. female with viral pharyngitis. Treatment with prednisone and Atrovent nasal spray. Follow-up as needed.  Discussed warning signs or symptoms. Please see discharge instructions. Patient expresses understanding.     Gregor Hams, MD 05/25/14 [phone]

## 2014-05-27 LAB — CULTURE, GROUP A STREP

## 2014-09-24 ENCOUNTER — Telehealth: Payer: Self-pay | Admitting: Obstetrics and Gynecology

## 2014-09-24 NOTE — Telephone Encounter (Signed)
Left patient a message to call back to reschedule a future appointment that was cancelled by the provider. °

## 2014-11-22 ENCOUNTER — Ambulatory Visit: Payer: Self-pay | Admitting: Obstetrics and Gynecology

## 2015-06-11 ENCOUNTER — Encounter (HOSPITAL_COMMUNITY): Payer: Self-pay | Admitting: Emergency Medicine

## 2015-06-11 ENCOUNTER — Emergency Department (HOSPITAL_COMMUNITY)
Admission: EM | Admit: 2015-06-11 | Discharge: 2015-06-11 | Disposition: A | Discharge status: Home / Self Care | Payer: BLUE CROSS/BLUE SHIELD | Source: Home / Self Care | Attending: Emergency Medicine | Admitting: Emergency Medicine

## 2015-06-11 DIAGNOSIS — J4521 Mild intermittent asthma with (acute) exacerbation: Secondary | ICD-10-CM

## 2015-06-11 DIAGNOSIS — J3489 Other specified disorders of nose and nasal sinuses: Secondary | ICD-10-CM | POA: Diagnosis not present

## 2015-06-11 MED ORDER — PREDNISONE 20 MG PO TABS
ORAL_TABLET | ORAL | Status: DC
Start: 2015-06-11 — End: 2015-07-04

## 2015-06-11 MED ORDER — ALBUTEROL SULFATE HFA 108 (90 BASE) MCG/ACT IN AERS: 2.0000 | INHALATION_SPRAY | RESPIRATORY_TRACT | Status: DC | PRN

## 2015-06-11 MED ORDER — IPRATROPIUM-ALBUTEROL 0.5-2.5 (3) MG/3ML IN SOLN
3.0000 mL | Freq: Once | RESPIRATORY_TRACT | Status: AC
  Administered 2015-06-11: 3 mL via RESPIRATORY_TRACT

## 2015-06-11 MED ORDER — IPRATROPIUM-ALBUTEROL 0.5-2.5 (3) MG/3ML IN SOLN
RESPIRATORY_TRACT | Status: AC
  Filled 2015-06-11: qty 3

## 2015-06-11 NOTE — ED Notes (Signed)
C/o asthma flare up onset x3 days associated w/prod cough, SOB, and chest tightness A&O x4.... No acute distress.

## 2015-06-11 NOTE — Discharge Instructions (Signed)
Asthma Attack Prevention °While you may not be able to control the fact that you have asthma, you can take actions to prevent asthma attacks. The best way to prevent asthma attacks is to maintain good control of your asthma. You can achieve this by: °· Taking your medicines as directed. °· Avoiding things that can irritate your airways or make your asthma symptoms worse (asthma triggers). °· Keeping track of how well your asthma is controlled and of any changes in your symptoms. °· Responding quickly to worsening asthma symptoms (asthma attack). °· Seeking emergency care when it is needed. °WHAT ARE SOME WAYS TO PREVENT AN ASTHMA ATTACK? °Have a Plan °Work with your health care provider to create a written plan for managing and treating your asthma attacks (asthma action plan). This plan includes: °· A list of your asthma triggers and how you can avoid them. °· Information on when medicines should be taken and when their dosages should be changed. °· The use of a device that measures how well your lungs are working (peak flow meter). °Monitor Your Asthma °Use your peak flow meter and record your results in a journal every day. A drop in your peak flow numbers on one or more days may indicate the start of an asthma attack. This can happen even before you start to feel symptoms. You can prevent an asthma attack from getting worse by following the steps in your asthma action plan. °Avoid Asthma Triggers °Work with your asthma health care provider to find out what your asthma triggers are. This can be done by: °· Allergy testing. °· Keeping a journal that notes when asthma attacks occur and the factors that may have contributed to them. °· Determining if there are other medical conditions that are making your asthma worse. °Once you have determined your asthma triggers, take steps to avoid them. This may include avoiding excessive or prolonged exposure to: °· Dust. Have someone dust and vacuum your home for you once or  twice a week. Using a high-efficiency particulate arrestance (HEPA) vacuum is best. °· Smoke. This includes campfire smoke, forest fire smoke, and secondhand smoke from tobacco products. °· Pet dander. Avoid contact with animals that you know you are allergic to. °· Allergens from trees, grasses or pollens. Avoid spending a lot of time outdoors when pollen counts are high, and on very windy days. °· Very cold, dry, or humid air. °· Mold. °· Foods that contain high amounts of sulfites. °· Strong odors. °· Outdoor air pollutants, such as engine exhaust. °· Indoor air pollutants, such as aerosol sprays and fumes from household cleaners. °· Household pests, including dust mites and cockroaches, and pest droppings. °· Certain medicines, including NSAIDs. Always talk to your health care provider before stopping or starting any new medicines. °Medicines °Take over-the-counter and prescription medicines only as told by your health care provider. Many asthma attacks can be prevented by carefully following your medicine schedule. Taking your medicines correctly is especially important when you cannot avoid certain asthma triggers. °Act Quickly °If an asthma attack does happen, acting quickly can decrease how severe it is and how long it lasts. Take these steps:  °· Pay attention to your symptoms. If you are coughing, wheezing, or having difficulty breathing, do not wait to see if your symptoms go away on their own. Follow your asthma action plan. °· If you have followed your asthma action plan and your symptoms are not improving, call your health care provider or seek immediate medical care   at the nearest hospital. It is important to note how often you need to use your fast-acting rescue inhaler. If you are using your rescue inhaler more often, it may mean that your asthma is not under control. Adjusting your asthma treatment plan may help you to prevent future asthma attacks and help you to gain better control of your  condition. HOW CAN I PREVENT AN ASTHMA ATTACK WHEN I EXERCISE? Follow advice from your health care provider about whether you should use your fast-acting inhaler before exercising. Many people with asthma experience exercise-induced bronchoconstriction (EIB). This condition often worsens during vigorous exercise in cold, humid, or dry environments. Usually, people with EIB can stay very active by pre-treating with a fast-acting inhaler before exercising.   This information is not intended to replace advice given to you by your health care provider. Make sure you discuss any questions you have with your health care provider.   Document Released: 05/13/2009 Document Revised: 02/13/2015 Document Reviewed: 10/25/2014 Elsevier Interactive Patient Education 2016 [employer].  Asthma, Adult Asthma is a recurring condition in which the airways tighten and narrow. Asthma can make it difficult to breathe. It can cause coughing, wheezing, and shortness of breath. Asthma episodes, also called asthma attacks, range from minor to life-threatening. Asthma cannot be cured, but medicines and lifestyle changes can help control it. CAUSES Asthma is believed to be caused by inherited (genetic) and environmental factors, but its exact cause is unknown. Asthma may be triggered by allergens, lung infections, or irritants in the air. Asthma triggers are different for each person. Common triggers include:   Animal dander.  Dust mites.  Cockroaches.  Pollen from trees or grass.  Mold.  Smoke.  Air pollutants such as dust, household cleaners, hair sprays, aerosol sprays, paint fumes, strong chemicals, or strong odors.  Cold air, weather changes, and winds (which increase molds and pollens in the air).  Strong emotional expressions such as crying or laughing hard.  Stress.  Certain medicines (such as aspirin) or types of drugs (such as beta-blockers).  Sulfites in foods and drinks. Foods and drinks that  may contain sulfites include dried fruit, potato chips, and sparkling grape juice.  Infections or inflammatory conditions such as the flu, a cold, or an inflammation of the nasal membranes (rhinitis).  Gastroesophageal reflux disease (GERD).  Exercise or strenuous activity. SYMPTOMS Symptoms may occur immediately after asthma is triggered or many hours later. Symptoms include:  Wheezing.  Excessive nighttime or early morning coughing.  Frequent or severe coughing with a common cold.  Chest tightness.  Shortness of breath. DIAGNOSIS  The diagnosis of asthma is made by a review of your medical history and a physical exam. Tests may also be performed. These may include:  Lung function studies. These tests show how much air you breathe in and out.  Allergy tests.  Imaging tests such as X-rays. TREATMENT  Asthma cannot be cured, but it can usually be controlled. Treatment involves identifying and avoiding your asthma triggers. It also involves medicines. There are 2 classes of medicine used for asthma treatment:   Controller medicines. These prevent asthma symptoms from occurring. They are usually taken every day.  Reliever or rescue medicines. These quickly relieve asthma symptoms. They are used as needed and provide short-term relief. Your health care provider will help you create an asthma action plan. An asthma action plan is a written plan for managing and treating your asthma attacks. It includes a list of your asthma triggers and how they  may be avoided. It also includes information on when medicines should be taken and when their dosage should be changed. An action plan may also involve the use of a device called a peak flow meter. A peak flow meter measures how well the lungs are working. It helps you monitor your condition. HOME CARE INSTRUCTIONS   Take medicines only as directed by your health care provider. Speak with your health care provider if you have questions about  how or when to take the medicines.  Use a peak flow meter as directed by your health care provider. Record and keep track of readings.  Understand and use the action plan to help minimize or stop an asthma attack without needing to seek medical care.  Control your home environment in the following ways to help prevent asthma attacks:  Do not smoke. Avoid being exposed to secondhand smoke.  Change your heating and air conditioning filter regularly.  Limit your use of fireplaces and wood stoves.  Get rid of pests (such as roaches and mice) and their droppings.  Throw away plants if you see mold on them.  Clean your floors and dust regularly. Use unscented cleaning products.  Try to have someone else vacuum for you regularly. Stay out of rooms while they are being vacuumed and for a short while afterward. If you vacuum, use a dust mask from a hardware store, a double-layered or microfilter vacuum cleaner bag, or a vacuum cleaner with a HEPA filter.  Replace carpet with wood, tile, or vinyl flooring. Carpet can trap dander and dust.  Use allergy-proof pillows, mattress covers, and box spring covers.  Wash bed sheets and blankets every week in hot water and dry them in a dryer.  Use blankets that are made of polyester or cotton.  Clean bathrooms and kitchens with bleach. If possible, have someone repaint the walls in these rooms with mold-resistant paint. Keep out of the rooms that are being cleaned and painted.  Wash hands frequently. SEEK MEDICAL CARE IF:   You have wheezing, shortness of breath, or a cough even if taking medicine to prevent attacks.  The colored mucus you cough up (sputum) is thicker than usual.  Your sputum changes from clear or white to yellow, green, gray, or bloody.  You have any problems that may be related to the medicines you are taking (such as a rash, itching, swelling, or trouble breathing).  You are using a reliever medicine more than 2-3 times per  week.  Your peak flow is still at 50-79% of your personal best after following your action plan for 1 hour.  You have a fever. SEEK IMMEDIATE MEDICAL CARE IF:   You seem to be getting worse and are unresponsive to treatment during an asthma attack.  You are short of breath even at rest.  You get short of breath when doing very little physical activity.  You have difficulty eating, drinking, or talking due to asthma symptoms.  You develop chest pain.  You develop a fast heartbeat.  You have a bluish color to your lips or fingernails.  You are light-headed, dizzy, or faint.  Your peak flow is less than 50% of your personal best.   This information is not intended to replace advice given to you by your health care provider. Make sure you discuss any questions you have with your health care provider.   Document Released: 05/25/2005 Document Revised: 02/13/2015 Document Reviewed: 12/22/2012 Elsevier Interactive Patient Education 2016 [employer].  Asthma, Acute  Bronchospasm Acute bronchospasm caused by asthma is also referred to as an asthma attack. Bronchospasm means your air passages become narrowed. The narrowing is caused by inflammation and tightening of the muscles in the air tubes (bronchi) in your lungs. This can make it hard to breathe or cause you to wheeze and cough. CAUSES Possible triggers are:  Animal dander from the skin, hair, or feathers of animals.  Dust mites contained in house dust.  Cockroaches.  Pollen from trees or grass.  Mold.  Cigarette or tobacco smoke.  Air pollutants such as dust, household cleaners, hair sprays, aerosol sprays, paint fumes, strong chemicals, or strong odors.  Cold air or weather changes. Cold air may trigger inflammation. Winds increase molds and pollens in the air.  Strong emotions such as crying or laughing hard.  Stress.  Certain medicines such as aspirin or beta-blockers.  Sulfites in foods and drinks, such as  dried fruits and wine.  Infections or inflammatory conditions, such as a flu, cold, or inflammation of the nasal membranes (rhinitis).  Gastroesophageal reflux disease (GERD). GERD is a condition where stomach acid backs up into your esophagus.  Exercise or strenuous activity. SIGNS AND SYMPTOMS   Wheezing.  Excessive coughing, particularly at night.  Chest tightness.  Shortness of breath. DIAGNOSIS  Your health care provider will ask you about your medical history and perform a physical exam. A chest X-ray or blood testing may be performed to look for other causes of your symptoms or other conditions that may have triggered your asthma attack. TREATMENT  Treatment is aimed at reducing inflammation and opening up the airways in your lungs. Most asthma attacks are treated with inhaled medicines. These include quick relief or rescue medicines (such as bronchodilators) and controller medicines (such as inhaled corticosteroids). These medicines are sometimes given through an inhaler or a nebulizer. Systemic steroid medicine taken by mouth or given through an IV tube also can be used to reduce the inflammation when an attack is moderate or severe. Antibiotic medicines are only used if a bacterial infection is present.  HOME CARE INSTRUCTIONS   Rest.  Drink plenty of liquids. This helps the mucus to remain thin and be easily coughed up. Only use caffeine in moderation and do not use alcohol until you have recovered from your illness.  Do not smoke. Avoid being exposed to secondhand smoke.  You play a critical role in keeping yourself in good health. Avoid exposure to things that cause you to wheeze or to have breathing problems.  Keep your medicines up-to-date and available. Carefully follow your health care provider's treatment plan.  Take your medicine exactly as prescribed.  When pollen or pollution is bad, keep windows closed and use an air conditioner or go to places with air  conditioning.  Asthma requires careful medical care. See your health care provider for a follow-up as advised. If you are more than [redacted] weeks pregnant and you were prescribed any new medicines, let your obstetrician know about the visit and how you are doing. Follow up with your health care provider as directed.  After you have recovered from your asthma attack, make an appointment with your outpatient doctor to talk about ways to reduce the likelihood of future attacks. If you do not have a doctor who manages your asthma, make an appointment with a primary care doctor to discuss your asthma. SEEK IMMEDIATE MEDICAL CARE IF:   You are getting worse.  You have trouble breathing. If severe, call your local emergency  services (911 in the [place]  You develop chest pain or discomfort.  You are vomiting.  You are not able to keep fluids down.  You are coughing up yellow, green, brown, or bloody sputum.  You have a fever and your symptoms suddenly get worse.  You have trouble swallowing. MAKE SURE YOU:   Understand these instructions.  Will watch your condition.  Will get help right away if you are not doing well or get worse.   This information is not intended to replace advice given to you by your health care provider. Make sure you discuss any questions you have with your health care provider.   Document Released: 09/09/2006 Document Revised: 05/30/2013 Document Reviewed: 11/30/2012 Elsevier Interactive Patient Education [employer].

## 2015-06-11 NOTE — ED Provider Notes (Signed)
CSN: [id number]     Arrival date & time 06/11/15  1529 History   First MD Initiated Contact with Patient 06/11/15 1802     Chief Complaint  Patient presents with  . Asthma   (Consider location/radiation/quality/duration/timing/severity/associated sxs/prior Treatment) HPI Comments: 30 year old female with a history of asthma diagnosed approximately one year ago is complaining of anterior upper chest tightness, shortness of breath, chest congestion, cough, PND. She is out of her albuterol and has been for several days. She states the symptoms started several days ago and is not more specific than that. She is currently taking Zyrtec and using Flonase.   Past Medical History  Diagnosis Date  . Anemia   . Vitamin D deficiency   . Allergy   . Depression   . Anxiety   . Dysmenorrhea   . Dyspareunia   . STD (sexually transmitted disease)     Herpes   Past Surgical History  Procedure Laterality Date  . Tonsillectomy  1991   Family History  Problem Relation Age of Onset  . Hypertension Father   . Hyperlipidemia Father   . Diabetes Maternal Grandmother   . Breast cancer Maternal Aunt 50    breast cancer   Social History  Substance Use Topics  . Smoking status: Never Smoker   . Smokeless tobacco: None  . Alcohol Use: 0.5 oz/week    1 Standard drinks or equivalent per week     Comment: ocassional   OB History    Gravida Para Term Preterm AB TAB SAB Ectopic Multiple Living   0              Review of Systems  Constitutional: Negative for fever, activity change and fatigue.  HENT: Positive for postnasal drip. Negative for ear pain and sore throat.   Eyes: Negative.   Respiratory: Positive for cough and shortness of breath.   Cardiovascular: Negative.   Neurological: Negative.     Allergies  Cholestatin  Home Medications   Prior to Admission medications   Medication Sig Start Date End Date Taking? Authorizing Provider  cetirizine (ZYRTEC) 10 MG tablet Take 1 tablet (10  mg total) by mouth daily. 04/22/14  Yes Melony Overly, MD  fluticasone (FLONASE) 50 MCG/ACT nasal spray Place 1 spray into both nostrils daily. 04/22/14  Yes Melony Overly, MD  albuterol (PROVENTIL HFA;VENTOLIN HFA) 108 (90 Base) MCG/ACT inhaler Inhale 2 puffs into the lungs every 4 (four) hours as needed for wheezing or shortness of breath. 06/11/15   Janne Napoleon, NP  erythromycin ophthalmic ointment Place a 1/2 inch ribbon of ointment into both lower eyelids qid 05/17/14   Janne Napoleon, NP  ipratropium (ATROVENT) 0.06 % nasal spray Place 2 sprays into both nostrils 4 (four) times daily as needed for rhinitis. 05/25/14   Gregor Hams, MD  predniSONE (DELTASONE) 20 MG tablet Take 3 tabs po on first day, 2 tabs second day, 2 tabs third day, 1 tab fourth day, 1 tab 5th day. Take with food. 06/11/15   Janne Napoleon, NP  sertraline (ZOLOFT) 50 MG tablet Take 1 tablet (50 mg total) by mouth daily. 01/18/14   Brook Oletta Lamas, MD   Meds Ordered and Administered this Visit   Medications  ipratropium-albuterol (DUONEB) 0.5-2.5 (3) MG/3ML nebulizer solution 3 mL (3 mLs Nebulization Given 06/11/15 1829)    BP 131/78 mmHg  Pulse 70  Temp(Src) 98.2 F (36.8 C) (Oral)  Resp 16  SpO2 100% No data found.  Physical Exam  Constitutional: She is oriented to person, place, and time. She appears well-developed and well-nourished. No distress.  HENT:  Right Ear: External ear normal.  Left Ear: External ear normal.  Mouth/Throat: No oropharyngeal exudate.  Oropharynx with minor erythema, cobblestoning and clear PND.  Eyes: Conjunctivae and EOM are normal.  Neck: Normal range of motion. Neck supple.  Cardiovascular: Normal rate, regular rhythm and normal heart sounds.   Pulmonary/Chest: Effort normal and breath sounds normal. No respiratory distress. She has no wheezes.  Musculoskeletal: She exhibits no edema.  Lymphadenopathy:    She has no cervical adenopathy.  Neurological: She is alert and oriented to  person, place, and time. She exhibits normal muscle tone.  Skin: Skin is warm and dry.  Psychiatric: She has a normal mood and affect. Her behavior is normal.  Nursing note and vitals reviewed.   ED Course  Procedures (including critical care time)  Labs Review Labs Reviewed - No data to display  Imaging Review No results found.   Visual Acuity Review  Right Eye Distance:   Left Eye Distance:   Bilateral Distance:    Right Eye Near:   Left Eye Near:    Bilateral Near:         MDM   1. Sinus drainage   2. Asthma exacerbation attacks, mild intermittent     Duoneb now. Post DuoNeb patient states she feeling and breathing better. Auscultation reveals no wheeze, much improved air movement and chest expansion.  Refill albuterol Prednisone taper dose. F/U with PCP ASAP,  Cont the antihistamine.    Janne Napoleon, NP 06/11/15 1845

## 2015-07-04 ENCOUNTER — Ambulatory Visit (INDEPENDENT_AMBULATORY_CARE_PROVIDER_SITE_OTHER): Payer: BLUE CROSS/BLUE SHIELD | Admitting: Obstetrics and Gynecology

## 2015-07-04 ENCOUNTER — Encounter: Payer: Self-pay | Admitting: Obstetrics and Gynecology

## 2015-07-04 VITALS — BP 120/74 | HR 76 | Resp 14 | Ht 63.0 in | Wt 259.6 lb

## 2015-07-04 DIAGNOSIS — N912 Amenorrhea, unspecified: Secondary | ICD-10-CM | POA: Diagnosis not present

## 2015-07-04 LAB — HCG, QUANTITATIVE, PREGNANCY: HCG, BETA CHAIN, QUANT, S: 692.1 m[IU]/mL — AB

## 2015-07-04 LAB — POCT URINE PREGNANCY: Preg Test, Ur: POSITIVE — AB

## 2015-07-04 NOTE — Progress Notes (Signed)
Patient ID: Renee Stafford, female   DOB: 01/30/1986, 30 y.o.   MRN: [id number] GYNECOLOGY  VISIT   HPI: 30 y.o.   Single  Caucasian  female   G0P0 with Patient's last menstrual period was 06/04/2015 (exact date).  Normal cycle.  Regular monthly menses.   here for pregnancy confirmation.  4+[redacted] weeks gestation by LMP.  Some left sided cramping greater than right sided cramping, both of which are mild.  No bleeding since LMP.  Food cravings.  Urinary frequency.   Concerned she is not healthy enough for a pregnancy.  Has asthma.   3 positive home pregnancy tests.  First positive test was 3 days ago.   Had a strong cramp at Thanksgiving time.  Resolved with ibuprofen.   GYNECOLOGIC HISTORY: Patient's last menstrual period was 06/04/2015 (exact date). Contraception:None Menopausal hormone therapy: none Last mammogram: n/a Last pap smear: 10-20-12 Neg:Neg HR HPV        OB History    Gravida Para Term Preterm AB TAB SAB Ectopic Multiple Living   1                  There are no active problems to display for this patient.   Past Medical History  Diagnosis Date  . Anemia   . Vitamin D deficiency   . Allergy   . Depression   . Anxiety   . Dysmenorrhea   . Dyspareunia   . STD (sexually transmitted disease)     Herpes    Past Surgical History  Procedure Laterality Date  . Tonsillectomy  1991    Current Outpatient Prescriptions  Medication Sig Dispense Refill  . albuterol (PROVENTIL HFA;VENTOLIN HFA) 108 (90 Base) MCG/ACT inhaler Inhale 2 puffs into the lungs every 4 (four) hours as needed for wheezing or shortness of breath. 1 Inhaler 0  . cetirizine (ZYRTEC) 10 MG tablet Take 1 tablet (10 mg total) by mouth daily. 30 tablet 0  . Cholecalciferol (VITAMIN D-3) 1000 units CAPS Take 1 capsule by mouth daily.    . fluticasone (FLONASE) 50 MCG/ACT nasal spray Place 1 spray into both nostrils daily. 16 g 0  . Multiple Vitamins-Minerals (MULTIVITAMIN GUMMIES WOMENS) CHEW  Chew 1 tablet by mouth daily.    . [DISCONTINUED] norethindrone (MICRONOR,CAMILA,ERRIN) 0.35 MG tablet Take 1 tablet (0.35 mg total) by mouth daily. 1 Package 9   No current facility-administered medications for this visit.     ALLERGIES: Cholestatin  Family History  Problem Relation Age of Onset  . Hypertension Father   . Hyperlipidemia Father   . Diabetes Maternal Grandmother   . Stroke Maternal Grandmother   . Breast cancer Maternal Aunt 50    breast cancer  . Asthma Mother     Social History   Social History  . Marital Status: Single    Spouse Name: N/A  . Number of Children: N/A  . Years of Education: N/A   Occupational History  . Not on file.   Social History Main Topics  . Smoking status: Never Smoker   . Smokeless tobacco: Not on file  . Alcohol Use: 0.5 oz/week    1 Standard drinks or equivalent per week     Comment: ocassional  . Drug Use: No  . Sexual Activity:    Partners: Male    Birth Control/ Protection: OCP, None   Other Topics Concern  . Not on file   Social History Narrative    ROS:  Pertinent items are noted in HPI.  PHYSICAL EXAMINATION:    BP 120/74 mmHg  Pulse 76  Resp 14  Ht 5\' 3"  (1.6 m)  Wt 259 lb 9.6 oz (117.754 kg)  BMI 46.00 kg/m2  LMP 06/04/2015 (Exact Date)    General appearance: alert, cooperative and appears stated age  Pelvic: External genitalia:  no lesions              Urethra:  normal appearing urethra with no masses, tenderness or lesions              Bartholins and Skenes: normal                 Vagina: normal appearing vagina with normal color and discharge, no lesions              Cervix: no lesions               Bimanual Exam:  Uterus:  4 week size, slightly tender.               Adnexa: Adnexa without masses, slight tenderness on left.  No guarding or rebound.           Chaperone was present for exam.  ASSESSMENT  Amenorrhea.  Early pregnancy.   PLAN  Counseled regarding early pregnancy.   Prenatal care provided - diet, exercise, avoidance of exposures.  Will check quant beta hCG today and tomorrow.  Ultrasound to follow.  List of obstetricians to patient.  Call for any bleeding or increased pain.   An After Visit Summary was printed and given to the patient.  _25_____ minutes face to face time of which over 50% was spent in counseling.

## 2015-07-05 ENCOUNTER — Other Ambulatory Visit: Payer: Self-pay | Admitting: Obstetrics and Gynecology

## 2015-07-05 ENCOUNTER — Other Ambulatory Visit (INDEPENDENT_AMBULATORY_CARE_PROVIDER_SITE_OTHER): Payer: BLUE CROSS/BLUE SHIELD

## 2015-07-05 DIAGNOSIS — N912 Amenorrhea, unspecified: Secondary | ICD-10-CM

## 2015-07-05 DIAGNOSIS — Z3490 Encounter for supervision of normal pregnancy, unspecified, unspecified trimester: Secondary | ICD-10-CM

## 2015-07-05 LAB — HCG, QUANTITATIVE, PREGNANCY: HCG, BETA CHAIN, QUANT, S: 1044.3 m[IU]/mL — AB

## 2015-07-08 ENCOUNTER — Ambulatory Visit: Payer: Self-pay | Admitting: Obstetrics and Gynecology

## 2015-07-09 ENCOUNTER — Telehealth: Payer: Self-pay | Admitting: Obstetrics and Gynecology

## 2015-07-09 NOTE — Telephone Encounter (Signed)
Called patient to discuss benefits for a procedure. Left Voicemail requesting a call back. °

## 2015-07-10 NOTE — Telephone Encounter (Signed)
Called patient to discuss benefits for a procedure. Left Voicemail requesting a call back. °

## 2015-07-10 NOTE — Telephone Encounter (Signed)
Patient returned call

## 2015-07-10 NOTE — Telephone Encounter (Signed)
Spoke with pt regarding benefit for ultrsound. Patient understood and agreeable. Patient ready to schedule. Patient scheduled 07/18/15 with Dr Quincy Simmonds. Pt aware of arrival date and time. Pt aware of 72 hours cancellation policy with [id number] fee. No further questions. Ok to close

## 2015-07-18 ENCOUNTER — Ambulatory Visit (INDEPENDENT_AMBULATORY_CARE_PROVIDER_SITE_OTHER): Payer: BLUE CROSS/BLUE SHIELD | Admitting: Obstetrics and Gynecology

## 2015-07-18 ENCOUNTER — Other Ambulatory Visit: Payer: Self-pay | Admitting: Obstetrics and Gynecology

## 2015-07-18 ENCOUNTER — Encounter: Payer: Self-pay | Admitting: Obstetrics and Gynecology

## 2015-07-18 ENCOUNTER — Ambulatory Visit (INDEPENDENT_AMBULATORY_CARE_PROVIDER_SITE_OTHER): Payer: BLUE CROSS/BLUE SHIELD

## 2015-07-18 VITALS — BP 138/82 | HR 80 | Ht 63.0 in | Wt 255.0 lb

## 2015-07-18 DIAGNOSIS — Z3490 Encounter for supervision of normal pregnancy, unspecified, unspecified trimester: Secondary | ICD-10-CM

## 2015-07-18 DIAGNOSIS — Z331 Pregnant state, incidental: Secondary | ICD-10-CM

## 2015-07-18 NOTE — Progress Notes (Signed)
Subjective  30 y.o. G64P0 Single  Caucasian female here for pelvic ultrasound for viability of fetus.   Patient's last menstrual period was 06/04/2015 (exact date).   Asking about diet during pregnancy.  Objective    Pelvic ultrasound images and report reviewed with patient.  Uterus - viable IUP 6+1 weeks.  FH 122.  18 mm anterior subserosal fibroid.  Cervix closed. EMS - NA. Ovaries - 20 mm right CL cyst. Free fluid - no       Assessment  Viable IUP. Size equals dates.  Plan  Proceed with care at [facility] office.  I suggested a nutrition consultation after she establishes care at her OB office.  Encouraged continuation of physical activity. Follow up with our office after delivery.   ____15___ minutes face to face time of which over 50% was spent in counseling.   After visit summary to patient.

## 2015-07-18 NOTE — Patient Instructions (Signed)
Best wishes for a happy and healthy pregnancy!

## 2015-07-26 LAB — OB RESULTS CONSOLE RPR: RPR: NONREACTIVE

## 2015-07-26 LAB — OB RESULTS CONSOLE RUBELLA ANTIBODY, IGM: RUBELLA: IMMUNE

## 2015-07-26 LAB — OB RESULTS CONSOLE GC/CHLAMYDIA
CHLAMYDIA, DNA PROBE: NEGATIVE
Gonorrhea: NEGATIVE

## 2015-07-26 LAB — OB RESULTS CONSOLE ANTIBODY SCREEN: Antibody Screen: NEGATIVE

## 2015-07-26 LAB — OB RESULTS CONSOLE ABO/RH: RH TYPE: POSITIVE

## 2015-07-26 LAB — OB RESULTS CONSOLE HIV ANTIBODY (ROUTINE TESTING): HIV: NONREACTIVE

## 2015-07-26 LAB — OB RESULTS CONSOLE HEPATITIS B SURFACE ANTIGEN: HEP B S AG: NEGATIVE

## 2016-01-29 LAB — OB RESULTS CONSOLE GBS: GBS: NEGATIVE

## 2016-02-12 ENCOUNTER — Other Ambulatory Visit: Payer: Self-pay | Admitting: Obstetrics and Gynecology

## 2016-02-24 ENCOUNTER — Inpatient Hospital Stay (HOSPITAL_COMMUNITY): Payer: BLUE CROSS/BLUE SHIELD

## 2016-02-24 ENCOUNTER — Encounter (HOSPITAL_COMMUNITY): Payer: Self-pay | Admitting: *Deleted

## 2016-02-24 ENCOUNTER — Inpatient Hospital Stay (HOSPITAL_COMMUNITY)
Admission: AD | Admit: 2016-02-24 | Discharge: 2016-02-24 | Disposition: A | Discharge status: Home / Self Care | Payer: BLUE CROSS/BLUE SHIELD | Source: Ambulatory Visit | Attending: Obstetrics and Gynecology | Admitting: Obstetrics and Gynecology

## 2016-02-24 DIAGNOSIS — Z3A37 37 weeks gestation of pregnancy: Secondary | ICD-10-CM | POA: Diagnosis not present

## 2016-02-24 DIAGNOSIS — O1493 Unspecified pre-eclampsia, third trimester: Secondary | ICD-10-CM | POA: Insufficient documentation

## 2016-02-24 DIAGNOSIS — O133 Gestational [pregnancy-induced] hypertension without significant proteinuria, third trimester: Secondary | ICD-10-CM

## 2016-02-24 DIAGNOSIS — R030 Elevated blood-pressure reading, without diagnosis of hypertension: Secondary | ICD-10-CM | POA: Diagnosis present

## 2016-02-24 LAB — COMPREHENSIVE METABOLIC PANEL
ALBUMIN: 3 g/dL — AB (ref 3.5–5.0)
ALK PHOS: 121 U/L (ref 38–126)
ALT: 17 U/L (ref 14–54)
AST: 24 U/L (ref 15–41)
Anion gap: 8 (ref 5–15)
BUN: 9 mg/dL (ref 6–20)
CALCIUM: 9.1 mg/dL (ref 8.9–10.3)
CO2: 22 mmol/L (ref 22–32)
CREATININE: 0.57 mg/dL (ref 0.44–1.00)
Chloride: 106 mmol/L (ref 101–111)
GFR calc Af Amer: >60 mL/min (ref >60)
GLUCOSE: 82 mg/dL (ref 65–99)
POTASSIUM: 3.9 mmol/L (ref 3.5–5.1)
Sodium: 136 mmol/L (ref 135–145)
TOTAL PROTEIN: 6.6 g/dL (ref 6.5–8.1)
Total Bilirubin: 0.4 mg/dL (ref 0.3–1.2)

## 2016-02-24 LAB — URINALYSIS, ROUTINE W REFLEX MICROSCOPIC
Bilirubin Urine: NEGATIVE
Glucose, UA: NEGATIVE mg/dL
Ketones, ur: NEGATIVE mg/dL
Nitrite: NEGATIVE
PROTEIN: NEGATIVE mg/dL
SPECIFIC GRAVITY, URINE: 1.01 (ref 1.005–1.030)
pH: 6 (ref 5.0–8.0)

## 2016-02-24 LAB — CBC
HCT: 38.2 % (ref 36.0–46.0)
HEMOGLOBIN: 13.7 g/dL (ref 12.0–15.0)
MCH: 32.3 pg (ref 26.0–34.0)
MCHC: 35.9 g/dL (ref 30.0–36.0)
MCV: 90.1 fL (ref 78.0–100.0)
Platelets: 129 10*3/uL — ABNORMAL LOW (ref 150–400)
RBC: 4.24 MIL/uL (ref 3.87–5.11)
RDW: 14.4 % (ref 11.5–15.5)
WBC: 12.8 10*3/uL — AB (ref 4.0–10.5)

## 2016-02-24 LAB — URINE MICROSCOPIC-ADD ON

## 2016-02-24 LAB — LACTATE DEHYDROGENASE: LDH: 212 U/L — AB (ref 98–192)

## 2016-02-24 LAB — PROTEIN / CREATININE RATIO, URINE
CREATININE, URINE: 57 mg/dL
Protein Creatinine Ratio: 0.12 mg/mg{Cre} (ref 0.00–0.15)
Total Protein, Urine: 7 mg/dL

## 2016-02-24 LAB — URIC ACID: Uric Acid, Serum: 4.2 mg/dL (ref 2.3–6.6)

## 2016-02-24 MED ORDER — SOD CITRATE-CITRIC ACID 500-334 MG/5ML PO SOLN
30.0000 mL | Freq: Once | ORAL | Status: AC
  Administered 2016-02-24: 30 mL via ORAL
  Filled 2016-02-24: qty 15

## 2016-02-24 NOTE — MAU Note (Signed)
Pt. States she noticed increased swelling in her hands and feet yesterday. Went to Dr for BP check today and they sent her here for BP evaluation. Pt. Is moving well. Denies LOF or bleeding. Pt is scheduled for c-section 9/26 for breech. Usually sees Dr. Raphael Gibney in office.

## 2016-02-24 NOTE — MAU Provider Note (Signed)
History     CSN: [id number]  Arrival date and time: 02/24/16 1714   None     Chief Complaint  Patient presents with  . Hypertension   HPI Renee Stafford is a 30 y.o. G1P0 at [redacted]w[redacted]d who presents from office for BP evaluation. Denies hx of hypertension. Reports BP in office today 160s/90s. Denies headache, vision changes, epigastric pain, ctx, LOF, or vaginal bleeding. Positive fetal movement. Is scheduled for C/s 9/26 d/t breech presentation.   OB History    Gravida Para Term Preterm AB Living   1             SAB TAB Ectopic Multiple Live Births                  Past Medical History:  Diagnosis Date  . Anemia   . Anxiety   . Depression   . Dysmenorrhea   . Dyspareunia   . STD (sexually transmitted disease)    Herpes  . Vitamin D deficiency     Past Surgical History:  Procedure Laterality Date  . TONSILLECTOMY  1991    Family History  Problem Relation Age of Onset  . Hypertension Father   . Hyperlipidemia Father   . Diabetes Maternal Grandmother   . Stroke Maternal Grandmother   . Asthma Mother   . Breast cancer Maternal Aunt 50    breast cancer    Social History  Substance Use Topics  . Smoking status: Never Smoker  . Smokeless tobacco: Never Used  . Alcohol use 0.6 oz/week    1 Standard drinks or equivalent per week     Comment: ocassional    Allergies:  Allergies  Allergen Reactions  . Cholestatin     Also allergic to dust, cats, dogs, mold    Prescriptions Prior to Admission  Medication Sig Dispense Refill Last Dose  . albuterol (PROVENTIL HFA;VENTOLIN HFA) 108 (90 Base) MCG/ACT inhaler Inhale 2 puffs into the lungs every 4 (four) hours as needed for wheezing or shortness of breath. 1 Inhaler 0 Past Month at Unknown time  . fluticasone (FLONASE) 50 MCG/ACT nasal spray Place 1 spray into both nostrils daily. 16 g 0 02/23/2016 at Unknown time  . IRON PO Take 1 tablet by mouth daily.   02/23/2016 at Unknown time  . loratadine (CLARITIN) 10 MG  tablet Take 10 mg by mouth daily.   02/23/2016 at Unknown time  . Prenatal Vit-Fe Fumarate-FA (PRENATAL VITAMIN PO) Take 1 tablet by mouth daily.   02/23/2016 at Unknown time  . valACYclovir (VALTREX) 1000 MG tablet Take 1,000 mg by mouth daily.  0 02/23/2016 at Unknown time    Review of Systems  Constitutional: Negative.   Eyes: Negative for blurred vision.  Respiratory: Negative for shortness of breath.   Cardiovascular: Negative for chest pain.  Gastrointestinal: Positive for heartburn. Negative for abdominal pain, nausea and vomiting.  Genitourinary: Negative.   Neurological: Negative for dizziness and headaches.   Physical Exam   Blood pressure 115/90, pulse 94, temperature 98.7 F (37.1 C), temperature source Oral, resp. rate 18, height 5\' 3"  (1.6 m), weight 287 lb (130.2 kg), last menstrual period 06/04/2015.  Patient Vitals for the past 24 hrs:  BP Temp Temp src Pulse Resp Height Weight  02/24/16 1925 115/90 - - 94 - - -  02/24/16 1924 111/83 - - 90 - - -  02/24/16 1908 126/78 - - 95 - - -  02/24/16 1850 150/89 - - 85 - - -  02/24/16 1835 (!) 148/52 - - 87 - - -  02/24/16 1820 155/89 - - 98 - - -  02/24/16 1805 146/89 - - 82 - - -  02/24/16 1755 143/86 - - 83 - - -  02/24/16 1743 136/87 98.7 F (37.1 C) Oral 91 18 - -  02/24/16 1725 - - - - - 5\' 3"  (1.6 m) 287 lb (130.2 kg)    Physical Exam  Nursing note and vitals reviewed. Constitutional: She is oriented to person, place, and time. She appears well-developed and well-nourished. No distress.  HENT:  Head: Normocephalic and atraumatic.  Eyes: Conjunctivae are normal. Right eye exhibits no discharge. Left eye exhibits no discharge. No scleral icterus.  Neck: Normal range of motion.  Cardiovascular: Normal rate, regular rhythm and normal heart sounds.   No murmur heard. Respiratory: Effort normal and breath sounds normal. No respiratory distress. She has no wheezes.  GI: Soft. There is no tenderness.  Musculoskeletal:  She exhibits edema (BLE).  Neurological: She is alert and oriented to person, place, and time. She has normal reflexes.  No clonus  Skin: Skin is warm and dry. She is not diaphoretic.  Psychiatric: She has a normal mood and affect. Her behavior is normal. Judgment and thought content normal.   Fetal Tracing:  Baseline: 140 Variability: moderate Accelerations: 15x15 Decelerations: none  Toco: none  Dilation: Closed Effacement (%): 50 Exam by:: Maryagnes Amos RN   MAU Course  Procedures Results for orders placed or performed during the hospital encounter of 02/24/16 (from the past 24 hour(s))  Urinalysis, Routine w reflex microscopic (not at [facility])     Status: Abnormal   Collection Time: 02/24/16  5:20 PM  Result Value Ref Range   Color, Urine YELLOW YELLOW   APPearance CLEAR CLEAR   Specific Gravity, Urine 1.010 1.005 - 1.030   pH 6.0 5.0 - 8.0   Glucose, UA NEGATIVE NEGATIVE mg/dL   Hgb urine dipstick TRACE (A) NEGATIVE   Bilirubin Urine NEGATIVE NEGATIVE   Ketones, ur NEGATIVE NEGATIVE mg/dL   Protein, ur NEGATIVE NEGATIVE mg/dL   Nitrite NEGATIVE NEGATIVE   Leukocytes, UA LARGE (A) NEGATIVE  Protein / creatinine ratio, urine     Status: None   Collection Time: 02/24/16  5:20 PM  Result Value Ref Range   Creatinine, Urine 57.00 mg/dL   Total Protein, Urine 7 mg/dL   Protein Creatinine Ratio 0.12 0.00 - 0.15 mg/mg[Cre]  Urine microscopic-add on     Status: Abnormal   Collection Time: 02/24/16  5:20 PM  Result Value Ref Range   Squamous Epithelial / LPF 0-5 (A) NONE SEEN   WBC, UA 6-30 0 - 5 WBC/hpf   RBC / HPF 0-5 0 - 5 RBC/hpf   Bacteria, UA RARE (A) NONE SEEN  CBC     Status: Abnormal   Collection Time: 02/24/16  6:01 PM  Result Value Ref Range   WBC 12.8 (H) 4.0 - 10.5 K/uL   RBC 4.24 3.87 - 5.11 MIL/uL   Hemoglobin 13.7 12.0 - 15.0 g/dL   HCT 38.2 36.0 - 46.0 %   MCV 90.1 78.0 - 100.0 fL   MCH 32.3 26.0 - 34.0 pg   MCHC 35.9 30.0 - 36.0 g/dL   RDW 14.4  11.5 - 15.5 %   Platelets 129 (L) 150 - 400 K/uL  Comprehensive metabolic panel     Status: Abnormal   Collection Time: 02/24/16  6:01 PM  Result Value Ref Range   Sodium  136 135 - 145 mmol/L   Potassium 3.9 3.5 - 5.1 mmol/L   Chloride 106 101 - 111 mmol/L   CO2 22 22 - 32 mmol/L   Glucose, Bld 82 65 - 99 mg/dL   BUN 9 6 - 20 mg/dL   Creatinine, Ser 0.57 0.44 - 1.00 mg/dL   Calcium 9.1 8.9 - 10.3 mg/dL   Total Protein 6.6 6.5 - 8.1 g/dL   Albumin 3.0 (L) 3.5 - 5.0 g/dL   AST 24 15 - 41 U/L   ALT 17 14 - 54 U/L   Alkaline Phosphatase 121 38 - 126 U/L   Total Bilirubin 0.4 0.3 - 1.2 mg/dL   GFR calc non Af Amer >60 >60 mL/min   GFR calc Af Amer >60 >60 mL/min   Anion gap 8 5 - 15  Lactate dehydrogenase     Status: Abnormal   Collection Time: 02/24/16  6:01 PM  Result Value Ref Range   LDH 212 (H) 98 - 192 U/L  Uric acid     Status: None   Collection Time: 02/24/16  6:01 PM  Result Value Ref Range   Uric Acid, Serum 4.2 2.3 - 6.6 mg/dL    MDM Reactive fetal tracing No severe range BPs CBC, CMP, LDH, uric acid, urine PCR Pt complaining of heartburn -- sodium citrate po S/w Dr. Mancel Bale regarding BPs & labs -- will order u/s for presentation and continue to monitor BPs NPO since 2 pm Ultrasound shows breech presentation & AFI 24.8 Just prior to d/c home fht with 2 variable decels -- s/w Dr. Mancel Bale, will continue to monitor FHT & BPs x 2 hours  Care turned over to [place]      02/24/2016 9:02 PM Jorje Guild, NP   2217: Patient has not had any further decelerations. B/P has remained normal as well.  Assessment and Plan   1. Preeclampsia, third trimester   2. [redacted] weeks gestation of pregnancy   3. Gestational hypertension w/o significant proteinuria in 3rd trimester    DC home Comfort measures reviewed  3rd Trimester precautions  PTL precautions  Fetal kick counts RX: none  Return to MAU as needed FU with OB as planned  Follow-up Information     Delice Lesch, MD .   Specialty:  Obstetrics and Gynecology Contact information: [address] [place] [address] [phone]

## 2016-02-24 NOTE — Discharge Instructions (Signed)

## 2016-02-25 ENCOUNTER — Encounter (HOSPITAL_COMMUNITY): Payer: Self-pay

## 2016-02-25 ENCOUNTER — Telehealth (HOSPITAL_COMMUNITY): Payer: Self-pay | Admitting: *Deleted

## 2016-02-25 NOTE — Telephone Encounter (Signed)
Preadmission screen  

## 2016-02-26 ENCOUNTER — Encounter (HOSPITAL_COMMUNITY): Payer: Self-pay

## 2016-03-02 ENCOUNTER — Encounter (HOSPITAL_COMMUNITY)
Admission: RE | Admit: 2016-03-02 | Discharge: 2016-03-02 | Disposition: A | Discharge status: Home / Self Care | Payer: BLUE CROSS/BLUE SHIELD | Source: Ambulatory Visit | Attending: Obstetrics and Gynecology | Admitting: Obstetrics and Gynecology

## 2016-03-02 HISTORY — DX: Benign neoplasm of connective and other soft tissue, unspecified: D21.9

## 2016-03-02 HISTORY — DX: Unspecified asthma, uncomplicated: J45.909

## 2016-03-02 HISTORY — DX: Personal history of other infectious and parasitic diseases: Z86.19

## 2016-03-02 HISTORY — DX: Herpesviral infection, unspecified: B00.9

## 2016-03-02 LAB — CBC
HCT: 37.2 % (ref 36.0–46.0)
Hemoglobin: 13.3 g/dL (ref 12.0–15.0)
MCH: 32 pg (ref 26.0–34.0)
MCHC: 35.8 g/dL (ref 30.0–36.0)
MCV: 89.6 fL (ref 78.0–100.0)
Platelets: 123 10*3/uL — ABNORMAL LOW (ref 150–400)
RBC: 4.15 MIL/uL (ref 3.87–5.11)
RDW: 14.3 % (ref 11.5–15.5)
WBC: 12.7 10*3/uL — ABNORMAL HIGH (ref 4.0–10.5)

## 2016-03-02 LAB — ABO/RH: ABO/RH(D): O POS

## 2016-03-02 NOTE — Patient Instructions (Signed)
[place]  03/02/2016   Your procedure is scheduled on:  03/03/2016  Enter through the Main Entrance of [facility] at [place] up the phone at the desk and dial [phone].   Call this number if you have problems the morning of surgery: [phone]   Remember:   Do not eat food:After Midnight.  Do not drink clear liquids: After Midnight.  Take these medicines the morning of surgery with A SIP OF WATER: none.  BRING INHALER WITH YOU   Do not wear jewelry, make-up or nail polish.  Do not wear lotions, powders, or perfumes. Do not wear deodorant.  Do not shave 48 hours prior to surgery.  Do not bring valuables to the hospital.  [facility] is not   responsible for any belongings or valuables brought to the hospital.  Contacts, dentures or bridgework may not be worn into surgery.  Leave suitcase in the car. After surgery it may be brought to your room.  For patients admitted to the hospital, checkout time is 11:00 AM the day of              discharge.   Patients discharged the day of surgery will not be allowed to drive             home.  Name and phone number of your driver: na  Special Instructions:   N/A   Please read over the following fact sheets that you were given:   Surgical Site Infection Prevention

## 2016-03-02 NOTE — H&P (Signed)
Admission History and Physical Exam for an Obstetrics Patient  Renee Stafford is a 29 y.o. female, G1P0, at [redacted] weeks gestation, who presents for a cesarean section. She has been followed at the [facility] and Gynecology division of [facility].  Her pregnancy has been complicated by:  BMI equals 52.6 Herpesvirus Breech presentation Polyhydramnios Large for gestational age infant Pregnancy-induced hypertension.   See history below.  OB History    Gravida Para Term Preterm AB Living   1             SAB TAB Ectopic Multiple Live Births                  Past Medical History:  Diagnosis Date  . Anemia   . Anxiety   . Asthma   . Depression   . Dysmenorrhea   . Dyspareunia   . Fibroid   . Herpes   . Hx of varicella   . STD (sexually transmitted disease)    Herpes  . Vitamin D deficiency     No prescriptions prior to admission.    Past Surgical History:  Procedure Laterality Date  . TONSILLECTOMY  1991    Allergies  Allergen Reactions  . Cholestatin     Also allergic to dust, cats, dogs, mold    Family History: family history includes Asthma in her mother; Breast cancer (age of onset: 31) in her maternal aunt; Diabetes in her maternal grandmother; Hyperlipidemia in her father; Hypertension in her father; Stroke in her maternal grandmother.  Social History:  reports that she has never smoked. She has never used smokeless tobacco. She reports that she drinks about 0.6 oz of alcohol per week . She reports that she does not use drugs.  Review of systems: Normal pregnancy complaints.  Admission Physical Exam:    There is no height or weight on file to calculate BMI.  Last menstrual period 06/04/2015.  HEENT:                 Within normal limits Chest:                   Clear Heart:                    Regular rate and rhythm Abdomen:             Gravid and nontender Extremities:          Grossly normal Neurologic exam:  Grossly normal Pelvic exam:         Cervix: Closed and long when last checked  Prenatal labs: ABO, Rh:             --/--/O POS, O POS (09/25 0945) HBsAg:                 Negative (02/17 0000)  HIV:                       Non-reactive (02/17 0000)  GBS:                     Negative (08/23 0000) Antibody:              NEG (09/25 0945) Rubella:                Immune RPR:                    Nonreactive (  02/17 0000)      Prenatal Transfer Tool  Maternal Diabetes: No Genetic Screening: Normal Maternal Ultrasounds/Referrals: Abnormal:  Findings:   Other: breech presentation, polyhydramnios Fetal Ultrasounds or other Referrals:  None Maternal Substance Abuse:  No Significant Maternal Medications:  None Significant Maternal Lab Results:  Lab values include: Other:  Other Comments:  Thrombocytopenia: Platelet count is 123,000  Assessment:  [redacted] weeks gestation BMI equals 52.6 Herpesvirus Breech presentation Polyhydramnios Large for gestational age infant Pregnancy-induced hypertension  Plan:  The patient will have a primary cesarean section. She understands the indication for her surgical procedure. She accepts the risk of, but not limited to, anesthetic complications, bleeding, infections, and possible damage to the surrounding organs.   Eli Hose 03/02/2016, 5:57 PM

## 2016-03-03 ENCOUNTER — Inpatient Hospital Stay (HOSPITAL_COMMUNITY)
Admission: AD | Admit: 2016-03-03 | Discharge: 2016-03-06 | DRG: 765 | Disposition: A | Discharge status: Home / Self Care | Payer: BLUE CROSS/BLUE SHIELD | Source: Ambulatory Visit | Attending: Obstetrics and Gynecology | Admitting: Obstetrics and Gynecology

## 2016-03-03 ENCOUNTER — Inpatient Hospital Stay (HOSPITAL_COMMUNITY): Payer: BLUE CROSS/BLUE SHIELD | Admitting: Anesthesiology

## 2016-03-03 ENCOUNTER — Encounter (HOSPITAL_COMMUNITY)
Admission: AD | Disposition: A | Discharge status: Home / Self Care | Payer: Self-pay | Source: Ambulatory Visit | Attending: Obstetrics and Gynecology

## 2016-03-03 ENCOUNTER — Encounter (HOSPITAL_COMMUNITY): Payer: Self-pay | Admitting: *Deleted

## 2016-03-03 DIAGNOSIS — O9952 Diseases of the respiratory system complicating childbirth: Secondary | ICD-10-CM | POA: Diagnosis present

## 2016-03-03 DIAGNOSIS — Z833 Family history of diabetes mellitus: Secondary | ICD-10-CM

## 2016-03-03 DIAGNOSIS — D252 Subserosal leiomyoma of uterus: Secondary | ICD-10-CM | POA: Diagnosis present

## 2016-03-03 DIAGNOSIS — O9912 Other diseases of the blood and blood-forming organs and certain disorders involving the immune mechanism complicating childbirth: Secondary | ICD-10-CM | POA: Diagnosis present

## 2016-03-03 DIAGNOSIS — D696 Thrombocytopenia, unspecified: Secondary | ICD-10-CM

## 2016-03-03 DIAGNOSIS — Z823 Family history of stroke: Secondary | ICD-10-CM

## 2016-03-03 DIAGNOSIS — O3413 Maternal care for benign tumor of corpus uteri, third trimester: Secondary | ICD-10-CM | POA: Diagnosis present

## 2016-03-03 DIAGNOSIS — O3663X0 Maternal care for excessive fetal growth, third trimester, not applicable or unspecified: Secondary | ICD-10-CM | POA: Diagnosis present

## 2016-03-03 DIAGNOSIS — D6959 Other secondary thrombocytopenia: Secondary | ICD-10-CM | POA: Diagnosis present

## 2016-03-03 DIAGNOSIS — Z8249 Family history of ischemic heart disease and other diseases of the circulatory system: Secondary | ICD-10-CM | POA: Diagnosis not present

## 2016-03-03 DIAGNOSIS — Z98891 History of uterine scar from previous surgery: Secondary | ICD-10-CM

## 2016-03-03 DIAGNOSIS — A6000 Herpesviral infection of urogenital system, unspecified: Secondary | ICD-10-CM | POA: Diagnosis present

## 2016-03-03 DIAGNOSIS — O9832 Other infections with a predominantly sexual mode of transmission complicating childbirth: Secondary | ICD-10-CM | POA: Diagnosis present

## 2016-03-03 DIAGNOSIS — O321XX0 Maternal care for breech presentation, not applicable or unspecified: Secondary | ICD-10-CM | POA: Diagnosis present

## 2016-03-03 DIAGNOSIS — Z825 Family history of asthma and other chronic lower respiratory diseases: Secondary | ICD-10-CM

## 2016-03-03 DIAGNOSIS — Z803 Family history of malignant neoplasm of breast: Secondary | ICD-10-CM

## 2016-03-03 DIAGNOSIS — O99119 Other diseases of the blood and blood-forming organs and certain disorders involving the immune mechanism complicating pregnancy, unspecified trimester: Secondary | ICD-10-CM

## 2016-03-03 DIAGNOSIS — Z3A39 39 weeks gestation of pregnancy: Secondary | ICD-10-CM

## 2016-03-03 DIAGNOSIS — O403XX0 Polyhydramnios, third trimester, not applicable or unspecified: Secondary | ICD-10-CM | POA: Diagnosis present

## 2016-03-03 DIAGNOSIS — O134 Gestational [pregnancy-induced] hypertension without significant proteinuria, complicating childbirth: Secondary | ICD-10-CM | POA: Diagnosis present

## 2016-03-03 DIAGNOSIS — Z6841 Body Mass Index (BMI) 40.0 and over, adult: Secondary | ICD-10-CM

## 2016-03-03 DIAGNOSIS — O99214 Obesity complicating childbirth: Secondary | ICD-10-CM | POA: Diagnosis present

## 2016-03-03 DIAGNOSIS — B009 Herpesviral infection, unspecified: Secondary | ICD-10-CM

## 2016-03-03 LAB — CBC
HEMATOCRIT: 37.4 % (ref 36.0–46.0)
Hemoglobin: 13.5 g/dL (ref 12.0–15.0)
MCH: 32.4 pg (ref 26.0–34.0)
MCHC: 36.1 g/dL — ABNORMAL HIGH (ref 30.0–36.0)
MCV: 89.7 fL (ref 78.0–100.0)
Platelets: 125 10*3/uL — ABNORMAL LOW (ref 150–400)
RBC: 4.17 MIL/uL (ref 3.87–5.11)
RDW: 14.3 % (ref 11.5–15.5)
WBC: 14 10*3/uL — AB (ref 4.0–10.5)

## 2016-03-03 LAB — CREATININE, SERUM: CREATININE: 0.54 mg/dL (ref 0.44–1.00)

## 2016-03-03 LAB — PREPARE RBC (CROSSMATCH)

## 2016-03-03 SURGERY — Surgical Case
Anesthesia: Spinal

## 2016-03-03 MED ORDER — FLUTICASONE PROPIONATE 50 MCG/ACT NA SUSP
1.0000 | Freq: Every day | NASAL | Status: DC
  Administered 2016-03-03 – 2016-03-05 (×3): 1 via NASAL
  Filled 2016-03-03: qty 16

## 2016-03-03 MED ORDER — NALBUPHINE HCL 10 MG/ML IJ SOLN
5.0000 mg | Freq: Once | INTRAMUSCULAR | Status: AC | PRN
  Administered 2016-03-04: 5 mg via INTRAVENOUS
  Filled 2016-03-03: qty 1

## 2016-03-03 MED ORDER — SIMETHICONE 80 MG PO CHEW
80.0000 mg | CHEWABLE_TABLET | Freq: Three times a day (TID) | ORAL | Status: DC
  Administered 2016-03-03 – 2016-03-06 (×8): 80 mg via ORAL
  Filled 2016-03-03 (×8): qty 1

## 2016-03-03 MED ORDER — DEXAMETHASONE SODIUM PHOSPHATE 10 MG/ML IJ SOLN
INTRAMUSCULAR | Status: AC
  Filled 2016-03-03: qty 1

## 2016-03-03 MED ORDER — COCONUT OIL OIL: 1.0000 "application " | TOPICAL_OIL | Status: DC | PRN

## 2016-03-03 MED ORDER — FENTANYL CITRATE (PF) 100 MCG/2ML IJ SOLN
INTRAMUSCULAR | Status: DC | PRN
  Administered 2016-03-03: 20 ug via INTRAVENOUS

## 2016-03-03 MED ORDER — ONDANSETRON HCL 4 MG/2ML IJ SOLN
INTRAMUSCULAR | Status: AC
  Filled 2016-03-03: qty 2

## 2016-03-03 MED ORDER — NALBUPHINE HCL 10 MG/ML IJ SOLN: 5.0000 mg | Freq: Once | INTRAMUSCULAR | Status: AC | PRN

## 2016-03-03 MED ORDER — ACETAMINOPHEN 500 MG PO TABS
1000.0000 mg | ORAL_TABLET | Freq: Four times a day (QID) | ORAL | Status: AC
  Administered 2016-03-03 – 2016-03-04 (×3): 1000 mg via ORAL
  Filled 2016-03-03 (×3): qty 2

## 2016-03-03 MED ORDER — DIPHENHYDRAMINE HCL 25 MG PO CAPS
25.0000 mg | ORAL_CAPSULE | ORAL | Status: DC | PRN
  Filled 2016-03-03: qty 1

## 2016-03-03 MED ORDER — OXYTOCIN 10 UNIT/ML IJ SOLN
INTRAMUSCULAR | Status: AC
Start: 2016-03-03 — End: 2016-03-03
  Filled 2016-03-03: qty 4

## 2016-03-03 MED ORDER — NALOXONE HCL 0.4 MG/ML IJ SOLN: 0.4000 mg | INTRAMUSCULAR | Status: DC | PRN

## 2016-03-03 MED ORDER — MORPHINE SULFATE-NACL 0.5-0.9 MG/ML-% IV SOSY
PREFILLED_SYRINGE | INTRAVENOUS | Status: AC
  Filled 2016-03-03: qty 1

## 2016-03-03 MED ORDER — DEXAMETHASONE SODIUM PHOSPHATE 10 MG/ML IJ SOLN
INTRAMUSCULAR | Status: DC | PRN
  Administered 2016-03-03: 10 mg via INTRAVENOUS

## 2016-03-03 MED ORDER — ZOLPIDEM TARTRATE 5 MG PO TABS: 5.0000 mg | ORAL_TABLET | Freq: Every evening | ORAL | Status: DC | PRN

## 2016-03-03 MED ORDER — BUPIVACAINE-EPINEPHRINE (PF) 0.5% -1:200000 IJ SOLN
INTRAMUSCULAR | Status: AC
  Filled 2016-03-03: qty 30

## 2016-03-03 MED ORDER — IBUPROFEN 600 MG PO TABS
600.0000 mg | ORAL_TABLET | Freq: Four times a day (QID) | ORAL | Status: DC
  Administered 2016-03-03 – 2016-03-06 (×12): 600 mg via ORAL
  Filled 2016-03-03 (×12): qty 1

## 2016-03-03 MED ORDER — BUPIVACAINE HCL (PF) 0.5 % IJ SOLN
INTRAMUSCULAR | Status: AC
Start: 2016-03-03 — End: 2016-03-03
  Filled 2016-03-03: qty 30

## 2016-03-03 MED ORDER — SODIUM CHLORIDE 0.9% FLUSH: 3.0000 mL | INTRAVENOUS | Status: DC | PRN

## 2016-03-03 MED ORDER — MEDROXYPROGESTERONE ACETATE 150 MG/ML IM SUSP: 150.0000 mg | INTRAMUSCULAR | Status: DC | PRN

## 2016-03-03 MED ORDER — MENTHOL 3 MG MT LOZG: 1.0000 | LOZENGE | OROMUCOSAL | Status: DC | PRN

## 2016-03-03 MED ORDER — LACTATED RINGERS IV SOLN
Freq: Once | INTRAVENOUS | Status: AC
  Administered 2016-03-03: 09:00:00 via INTRAVENOUS

## 2016-03-03 MED ORDER — LACTATED RINGERS IV SOLN
INTRAVENOUS | Status: DC
  Administered 2016-03-03: 20:00:00 via INTRAVENOUS

## 2016-03-03 MED ORDER — DIBUCAINE 1 % RE OINT: 1.0000 "application " | TOPICAL_OINTMENT | RECTAL | Status: DC | PRN

## 2016-03-03 MED ORDER — SCOPOLAMINE 1 MG/3DAYS TD PT72
1.0000 | MEDICATED_PATCH | Freq: Once | TRANSDERMAL | Status: AC
  Administered 2016-03-03: 1.5 mg via TRANSDERMAL

## 2016-03-03 MED ORDER — LORATADINE 10 MG PO TABS
10.0000 mg | ORAL_TABLET | Freq: Every day | ORAL | Status: DC
  Filled 2016-03-03 (×2): qty 1

## 2016-03-03 MED ORDER — NALBUPHINE HCL 10 MG/ML IJ SOLN
5.0000 mg | INTRAMUSCULAR | Status: DC | PRN
Start: 2016-03-03 — End: 2016-03-06

## 2016-03-03 MED ORDER — BUPIVACAINE-EPINEPHRINE 0.5% -1:200000 IJ SOLN
INTRAMUSCULAR | Status: DC | PRN
  Administered 2016-03-03: 10 mL

## 2016-03-03 MED ORDER — MORPHINE SULFATE (PF) 0.5 MG/ML IJ SOLN
INTRAMUSCULAR | Status: DC | PRN
  Administered 2016-03-03: .2 ug via EPIDURAL

## 2016-03-03 MED ORDER — MEASLES, MUMPS & RUBELLA VAC ~~LOC~~ INJ
0.5000 mL | INJECTION | Freq: Once | SUBCUTANEOUS | Status: DC
  Filled 2016-03-03: qty 0.5

## 2016-03-03 MED ORDER — ONDANSETRON HCL 4 MG/2ML IJ SOLN
INTRAMUSCULAR | Status: DC | PRN
  Administered 2016-03-03: 4 mg via INTRAVENOUS

## 2016-03-03 MED ORDER — PROMETHAZINE HCL 25 MG/ML IJ SOLN: 6.2500 mg | INTRAMUSCULAR | Status: DC | PRN

## 2016-03-03 MED ORDER — NALBUPHINE HCL 10 MG/ML IJ SOLN: 5.0000 mg | INTRAMUSCULAR | Status: DC | PRN

## 2016-03-03 MED ORDER — ONDANSETRON HCL 4 MG/2ML IJ SOLN: 4.0000 mg | Freq: Three times a day (TID) | INTRAMUSCULAR | Status: DC | PRN

## 2016-03-03 MED ORDER — ALBUTEROL SULFATE (2.5 MG/3ML) 0.083% IN NEBU: 2.5000 mg | INHALATION_SOLUTION | RESPIRATORY_TRACT | Status: DC | PRN

## 2016-03-03 MED ORDER — MEPERIDINE HCL 25 MG/ML IJ SOLN
INTRAMUSCULAR | Status: DC | PRN
  Administered 2016-03-03 (×2): 12.5 mg via INTRAVENOUS

## 2016-03-03 MED ORDER — BUPIVACAINE IN DEXTROSE 0.75-8.25 % IT SOLN
INTRATHECAL | Status: DC | PRN
  Administered 2016-03-03: 1.6 mg via INTRATHECAL

## 2016-03-03 MED ORDER — SIMETHICONE 80 MG PO CHEW: 80.0000 mg | CHEWABLE_TABLET | ORAL | Status: DC | PRN

## 2016-03-03 MED ORDER — WITCH HAZEL-GLYCERIN EX PADS: 1.0000 "application " | MEDICATED_PAD | CUTANEOUS | Status: DC | PRN

## 2016-03-03 MED ORDER — ENOXAPARIN SODIUM 40 MG/0.4ML ~~LOC~~ SOLN
40.0000 mg | SUBCUTANEOUS | Status: DC
  Administered 2016-03-04 – 2016-03-06 (×3): 40 mg via SUBCUTANEOUS
  Filled 2016-03-03 (×4): qty 0.4

## 2016-03-03 MED ORDER — NALOXONE HCL 2 MG/2ML IJ SOSY
1.0000 ug/kg/h | PREFILLED_SYRINGE | INTRAVENOUS | Status: DC | PRN
  Filled 2016-03-03: qty 2

## 2016-03-03 MED ORDER — PRENATAL MULTIVITAMIN CH
1.0000 | ORAL_TABLET | Freq: Every day | ORAL | Status: DC
  Administered 2016-03-04 – 2016-03-06 (×3): 1 via ORAL
  Filled 2016-03-03 (×3): qty 1

## 2016-03-03 MED ORDER — DEXTROSE 5 % IV SOLN
3.0000 g | INTRAVENOUS | Status: AC
  Administered 2016-03-03: 3 g via INTRAVENOUS
  Filled 2016-03-03: qty 3000

## 2016-03-03 MED ORDER — OXYTOCIN 10 UNIT/ML IJ SOLN
INTRAVENOUS | Status: DC | PRN
  Administered 2016-03-03: 40 [IU] via INTRAVENOUS

## 2016-03-03 MED ORDER — PHENYLEPHRINE 40 MCG/ML (10ML) SYRINGE FOR IV PUSH (FOR BLOOD PRESSURE SUPPORT)
PREFILLED_SYRINGE | INTRAVENOUS | Status: DC | PRN
  Administered 2016-03-03: 40 ug via INTRAVENOUS
  Administered 2016-03-03: 80 ug via INTRAVENOUS

## 2016-03-03 MED ORDER — MEPERIDINE HCL 25 MG/ML IJ SOLN
INTRAMUSCULAR | Status: AC
  Filled 2016-03-03: qty 1

## 2016-03-03 MED ORDER — PHENYLEPHRINE 8 MG IN D5W 100 ML (0.08MG/ML) PREMIX OPTIME
INJECTION | INTRAVENOUS | Status: AC
  Filled 2016-03-03: qty 100

## 2016-03-03 MED ORDER — SIMETHICONE 80 MG PO CHEW
80.0000 mg | CHEWABLE_TABLET | ORAL | Status: DC
  Administered 2016-03-04 – 2016-03-06 (×3): 80 mg via ORAL
  Filled 2016-03-03 (×3): qty 1

## 2016-03-03 MED ORDER — SENNOSIDES-DOCUSATE SODIUM 8.6-50 MG PO TABS
2.0000 | ORAL_TABLET | ORAL | Status: DC
  Administered 2016-03-04 – 2016-03-06 (×3): 2 via ORAL
  Filled 2016-03-03 (×3): qty 2

## 2016-03-03 MED ORDER — SCOPOLAMINE 1 MG/3DAYS TD PT72
MEDICATED_PATCH | TRANSDERMAL | Status: AC
  Filled 2016-03-03: qty 1

## 2016-03-03 MED ORDER — ACETAMINOPHEN 325 MG PO TABS: 650.0000 mg | ORAL_TABLET | ORAL | Status: DC | PRN

## 2016-03-03 MED ORDER — LACTATED RINGERS IV SOLN
INTRAVENOUS | Status: DC
  Administered 2016-03-03 (×2): via INTRAVENOUS

## 2016-03-03 MED ORDER — MEPERIDINE HCL 25 MG/ML IJ SOLN: 6.2500 mg | INTRAMUSCULAR | Status: DC | PRN

## 2016-03-03 MED ORDER — DIPHENHYDRAMINE HCL 50 MG/ML IJ SOLN: 12.5000 mg | INTRAMUSCULAR | Status: DC | PRN

## 2016-03-03 MED ORDER — SODIUM CHLORIDE 0.9 % IR SOLN
Status: DC | PRN
  Administered 2016-03-03: 1

## 2016-03-03 MED ORDER — OXYTOCIN 40 UNITS IN LACTATED RINGERS INFUSION - SIMPLE MED: 2.5000 [IU]/h | INTRAVENOUS | Status: AC

## 2016-03-03 MED ORDER — FENTANYL CITRATE (PF) 100 MCG/2ML IJ SOLN: 25.0000 ug | INTRAMUSCULAR | Status: DC | PRN

## 2016-03-03 MED ORDER — FENTANYL CITRATE (PF) 100 MCG/2ML IJ SOLN
INTRAMUSCULAR | Status: AC
  Filled 2016-03-03: qty 2

## 2016-03-03 MED ORDER — TETANUS-DIPHTH-ACELL PERTUSSIS 5-2.5-18.5 LF-MCG/0.5 IM SUSP: 0.5000 mL | Freq: Once | INTRAMUSCULAR | Status: DC

## 2016-03-03 MED ORDER — DIPHENHYDRAMINE HCL 25 MG PO CAPS
25.0000 mg | ORAL_CAPSULE | Freq: Four times a day (QID) | ORAL | Status: DC | PRN
Start: 2016-03-03 — End: 2016-03-06

## 2016-03-03 SURGICAL SUPPLY — 39 items
CHLORAPREP W/TINT 26ML (MISCELLANEOUS) ×3 IMPLANT
CLAMP CORD UMBIL (MISCELLANEOUS) IMPLANT
CLOTH BEACON ORANGE TIMEOUT ST (SAFETY) ×3 IMPLANT
CONTAINER PREFILL 10% NBF 15ML (MISCELLANEOUS) IMPLANT
DRAIN JACKSON PRT FLT 7MM (DRAIN) IMPLANT
DRSG OPSITE POSTOP 4X10 (GAUZE/BANDAGES/DRESSINGS) ×3 IMPLANT
ELECT REM PT RETURN 9FT ADLT (ELECTROSURGICAL) ×3
ELECTRODE REM PT RTRN 9FT ADLT (ELECTROSURGICAL) ×1 IMPLANT
EVACUATOR SILICONE 100CC (DRAIN) IMPLANT
EXTRACTOR VACUUM M CUP 4 TUBE (SUCTIONS) IMPLANT
EXTRACTOR VACUUM M CUP 4' TUBE (SUCTIONS)
GLOVE BIOGEL PI IND STRL 7.0 (GLOVE) ×1 IMPLANT
GLOVE BIOGEL PI IND STRL 8.5 (GLOVE) ×1 IMPLANT
GLOVE BIOGEL PI INDICATOR 7.0 (GLOVE) ×2
GLOVE BIOGEL PI INDICATOR 8.5 (GLOVE) ×2
GLOVE ECLIPSE 8.0 STRL XLNG CF (GLOVE) ×6 IMPLANT
GOWN STRL REUS W/TWL LRG LVL3 (GOWN DISPOSABLE) ×9 IMPLANT
KIT ABG SYR 3ML LUER SLIP (SYRINGE) IMPLANT
NEEDLE HYPO 22GX1.5 SAFETY (NEEDLE) ×3 IMPLANT
NEEDLE HYPO 25X5/8 SAFETYGLIDE (NEEDLE) IMPLANT
PACK C SECTION WH (CUSTOM PROCEDURE TRAY) ×3 IMPLANT
PAD ABD 8X7 1/2 STERILE (GAUZE/BANDAGES/DRESSINGS) ×3 IMPLANT
PAD OB MATERNITY 4.3X12.25 (PERSONAL CARE ITEMS) ×3 IMPLANT
PENCIL SMOKE EVAC W/HOLSTER (ELECTROSURGICAL) ×3 IMPLANT
RINGERS IRRIG 1000ML POUR BTL (IV SOLUTION) ×3 IMPLANT
SUT MNCRL AB 3-0 PS2 27 (SUTURE) ×3 IMPLANT
SUT PLAIN 0 NONE (SUTURE) IMPLANT
SUT PLAIN 2 0 XLH (SUTURE) ×3 IMPLANT
SUT SILK 3 0 FS 1X18 (SUTURE) IMPLANT
SUT VIC AB 0 CT1 27 (SUTURE) ×4
SUT VIC AB 0 CT1 27XBRD ANBCTR (SUTURE) ×2 IMPLANT
SUT VIC AB 2-0 CTX 36 (SUTURE) ×6 IMPLANT
SUT VIC AB 3-0 CT1 27 (SUTURE) ×2
SUT VIC AB 3-0 CT1 TAPERPNT 27 (SUTURE) ×1 IMPLANT
SUT VIC AB 3-0 SH 27 (SUTURE)
SUT VIC AB 3-0 SH 27X BRD (SUTURE) IMPLANT
SYR CONTROL 10ML LL (SYRINGE) ×3 IMPLANT
TOWEL OR 17X24 6PK STRL BLUE (TOWEL DISPOSABLE) ×3 IMPLANT
TRAY FOLEY CATH SILVER 14FR (SET/KITS/TRAYS/PACK) ×3 IMPLANT

## 2016-03-03 NOTE — Anesthesia Preprocedure Evaluation (Addendum)
Anesthesia Evaluation  Patient identified by MRN, date of birth, ID band Patient awake    Reviewed: Allergy & Precautions, NPO status , Patient's Chart, lab work & pertinent test results  Airway Mallampati: II  TM Distance: >3 FB Neck ROM: Full    Dental  (+) Teeth Intact, Dental Advisory Given   Pulmonary asthma ,    Pulmonary exam normal breath sounds clear to auscultation       Cardiovascular Exercise Tolerance: Good Normal cardiovascular exam Rhythm:Regular Rate:Normal  Gestational hypertension--no meds   Neuro/Psych PSYCHIATRIC DISORDERS Anxiety Depression negative neurological ROS     GI/Hepatic negative GI ROS, Neg liver ROS,   Endo/Other  Super morbid obesity  Renal/GU negative Renal ROS     Musculoskeletal negative musculoskeletal ROS (+)   Abdominal   Peds  Hematology Plt 125k on 03/03/16   Anesthesia Other Findings Day of surgery medications reviewed with the patient.  Reproductive/Obstetrics (+) Pregnancy Pregnancy induced hypertension                           Anesthesia Physical Anesthesia Plan  ASA: IV  Anesthesia Plan: Spinal   Post-op Pain Management:    Induction:   Airway Management Planned:   Additional Equipment:   Intra-op Plan:   Post-operative Plan:   Informed Consent: I have reviewed the patients History and Physical, chart, labs and discussed the procedure including the risks, benefits and alternatives for the proposed anesthesia with the patient or authorized representative who has indicated his/her understanding and acceptance.   Dental advisory given  Plan Discussed with: CRNA, Anesthesiologist and Surgeon  Anesthesia Plan Comments: (Discussed risks and benefits of and differences between spinal and general. Discussed risks of spinal including headache, backache, failure, bleeding, infection, and nerve damage. Patient consents to spinal. Questions  answered. Coagulation studies and platelet count acceptable.)        Anesthesia Quick Evaluation

## 2016-03-03 NOTE — Anesthesia Postprocedure Evaluation (Signed)
Anesthesia Post Note  Patient: DYANI QUELLA  Procedure(s) Performed: Procedure(s) (LRB): CESAREAN SECTION (N/A)  Patient location during evaluation: Mother Baby Anesthesia Type: Spinal Level of consciousness: awake and alert and oriented Pain management: satisfactory to patient Vital Signs Assessment: post-procedure vital signs reviewed and stable Respiratory status: spontaneous breathing and nonlabored ventilation Cardiovascular status: stable Postop Assessment: no headache, no backache, patient able to bend at knees, no signs of nausea or vomiting and adequate PO intake Anesthetic complications: no     Last Vitals:  Vitals:   03/03/16 1530 03/03/16 1630  BP: 110/65 111/62  Pulse: 64 65  Resp: 18 18  Temp: 36.8 C 36.8 C    Last Pain:  Vitals:   03/03/16 1630  TempSrc: Oral  PainSc:    Pain Goal: Patients Stated Pain Goal: 5 (03/03/16 0823)               Willa Rough

## 2016-03-03 NOTE — Progress Notes (Signed)
The patient was interviewed and examined today.  The previously documented history and physical examination was reviewed. There are no changes. The operative procedure was reviewed. The risks and benefits were outlined again. The specific risks include, but are not limited to, anesthetic complications, bleeding, infections, and possible damage to the surrounding organs. The patient's questions were answered.  We are ready to proceed as outlined. The likelihood of the patient achieving the goals of this procedure is very likely.   Kiearra Oyervides Vernon Brianna Esson, M.D.  

## 2016-03-03 NOTE — Anesthesia Procedure Notes (Signed)
Spinal  Patient location during procedure: OR Staffing Anesthesiologist: Catalina Gravel Performed: anesthesiologist  Preanesthetic Checklist Completed: patient identified, surgical consent, pre-op evaluation, timeout performed, IV checked, risks and benefits discussed and monitors and equipment checked Spinal Block Patient position: sitting Prep: site prepped and draped and DuraPrep Patient monitoring: continuous pulse ox and blood pressure Approach: midline Location: L3-4 Injection technique: single-shot Needle Needle type: Pencan  Needle gauge: 25 G Needle length: 9 cm Assessment Sensory level: T4 Additional Notes Functioning IV was confirmed and monitors were applied. Sterile prep and drape, including hand hygiene, mask and sterile gloves were used. The patient was positioned and the spine was prepped. The skin was anesthetized with lidocaine.  Free flow of clear CSF was obtained prior to injecting local anesthetic into the CSF.  The spinal needle aspirated freely following injection.  The needle was carefully withdrawn.  The patient tolerated the procedure well. Consent was obtained prior to procedure with all questions answered and concerns addressed. Risks including but not limited to bleeding, infection, nerve damage, paralysis, failed block, inadequate analgesia, allergic reaction, high spinal, itching and headache were discussed and the patient wished to proceed.   Renee Morn, MD

## 2016-03-03 NOTE — Transfer of Care (Signed)
Immediate Anesthesia Transfer of Care Note  Patient: Renee Stafford  Procedure(s) Performed: Procedure(s) with comments: CESAREAN SECTION (N/A) - RNFA req [facility] MD is requesting an RNFA to assist, please.  -ap  Patient Location: PACU  Anesthesia Type:Spinal  Level of Consciousness: awake, alert , oriented and patient cooperative  Airway & Oxygen Therapy: Patient Spontanous Breathing  Post-op Assessment: Report given to RN and Post -op Vital signs reviewed and stable  Post vital signs: Reviewed and stable  Last Vitals:  Vitals:   03/03/16 0823  BP: (!) 137/96  Pulse: 96  Resp: 18  Temp: 36.9 C    Last Pain:  Vitals:   03/03/16 0823  TempSrc: Oral      Patients Stated Pain Goal: 5 ([id number])  Complications: No apparent anesthesia complications

## 2016-03-03 NOTE — Addendum Note (Signed)
Addendum  created 03/03/16 1724 by Jonna Munro, CRNA   Sign clinical note

## 2016-03-03 NOTE — Lactation Note (Signed)
This note was copied from a baby's chart. Lactation Consultation Note  Patient Name: Renee Stafford [id number] Date: 03/03/2016 Reason for consult: Initial assessment Baby at 6 hr of life. Mom denies breast or nipple pain. She is worried about latch and supply. Discussed baby behavior, feeding frequency, baby belly size, voids, wt loss, breast changes, and nipple care. Demonstrated manual expression, colostrum noted bilaterally, spoon in room. Given lactation handouts. Aware of OP services and support group.     Maternal Data Has patient been taught Hand Expression?: Yes Does the patient have breastfeeding experience prior to this delivery?: No  Feeding Feeding Type: Breast Fed Length of feed: 15 min  LATCH Score/Interventions Latch: Repeated attempts needed to sustain latch, nipple held in mouth throughout feeding, stimulation needed to elicit sucking reflex. Intervention(s): Adjust position;Assist with latch;Breast massage;Breast compression  Audible Swallowing: None Intervention(s): Skin to skin;Hand expression Intervention(s): Alternate breast massage  Type of Nipple: Everted at rest and after stimulation  Comfort (Breast/Nipple): Soft / non-tender     Hold (Positioning): Full assist, staff holds infant at breast Intervention(s): Support Pillows;Position options  LATCH Score: 5  Lactation Tools Discussed/Used WIC Program: No   Consult Status Consult Status: Follow-up Date: 03/04/16 Follow-up type: In-patient    Denzil Hughes 03/03/2016, 5:43 PM

## 2016-03-03 NOTE — Op Note (Signed)
OPERATIVE NOTE  Patient's Name: [name]  Date of Birth: April 06, 1986  Medical Records Number: [id number]  Date of Operation: 03/03/2016  Preoperative diagnosis:  [redacted]w[redacted]d weeks gestation BMI equals 52.6 Herpesvirus Breech presentation Polyhydramnios Large for gestational age infant Pregnancy-induced hypertension.    Postoperative diagnosis:  Same  Fibroid Uterus  Procedure:  Primary low transverse cesarean section  Surgeon:  Gildardo Cranker, M.D.  Assistant:  Irene Shipper, certified nurse midwife  Anesthesia:  Regional  Disposition:  Renee Stafford is a 30 y.o. female, G1P1001, who presents at [redacted]w[redacted]d weeks gestation. The patient has been followed at the [facility] obstetrics and gynecology division of [facility]. She has the above mentioned diagnosis. She understands the indications for her procedure and she accepts the risk of, but not limited to, anesthetic complications, bleeding, infections, and possible damage to the surrounding organs.  Findings:  A female Donalda Ewings) was delivered from a breech position.  The Apgar scores were 8/9 . The uterus, fallopian tubes, and ovaries were normal for the gravid state except for 3 subserosal fibroids on the fundus of the uterus with each fibroid measuring less than 3 cm.  Procedure:  The patient was taken to the operating room where a spinal anesthetic was given. The patient's abdomen was prepped with Chloraprep. The perineum was prepped with betadine. A Foley catheter was placed in the bladder. The patient was sterilely draped. A "timeout" was performed which properly identified the patient and the correct operative procedure. The lower abdomen was injected with half percent Marcaine with epinephrine. A low transverse incision was made in the abdomen and carried sharply through the subcutaneous tissue, the fascia, and the anterior peritoneum. An incision was made in the lower uterine  segment. The incision was extended in a low transverse fashion. The membranes were ruptured. Clear fluid was noted. The infant was delivered from a complete breech presentation without difficulty. The cord was not cut for 1 minute. The cord was clamped and cut. The infant was handed to the awaiting pediatric team. The placenta was removed. The uterine cavity was cleaned of amniotic fluid, clotted blood, and membranes. The uterine incision was closed using a running locking suture of 2-0 Vicryl. An imbricating suture of 2-0 Vicryl was placed. The pelvis was vigorously irrigated. Hemostasis was adequate. The anterior peritoneum and the abdominal musculature were closed using 2-0 Vicryl. The fascia was closed using a running suture of 0 Vicryl followed by 3 interrupted sutures of 0 Vicryl. The subcutaneous layer was closed using interrupted sutures of 2-0 Vicryl. The skin was reapproximated using a subcuticular suture of 3-0 Monocryl. Sponge, needle, and instrument counts were correct on 2 occasions. The estimated blood loss for the procedure was 800 cc. The patient tolerated her procedure well. She was transported to the recovery room in stable condition. The infant was taken to the full-term nursery in stable condition. The placenta was sent to labor and delivery.  Gildardo Cranker, M.D. 03/03/2016 10:50 AM

## 2016-03-03 NOTE — Anesthesia Postprocedure Evaluation (Signed)
Anesthesia Post Note  Patient: Renee Stafford  Procedure(s) Performed: Procedure(s) (LRB): CESAREAN SECTION (N/A)  Patient location during evaluation: PACU Anesthesia Type: Spinal Level of consciousness: oriented and awake and alert Pain management: pain level controlled Vital Signs Assessment: post-procedure vital signs reviewed and stable Respiratory status: spontaneous breathing, respiratory function stable and patient connected to nasal cannula oxygen Cardiovascular status: blood pressure returned to baseline and stable Postop Assessment: no headache, no backache, spinal receding, no signs of nausea or vomiting and patient able to bend at knees Anesthetic complications: no     Last Vitals:  Vitals:   03/03/16 1254 03/03/16 1415  BP: 112/70 122/69  Pulse: 78 63  Resp: 19 18  Temp: 36.8 C 36.9 C    Last Pain:  Vitals:   03/03/16 1415  TempSrc: Oral  PainSc: 0-No pain   Pain Goal: Patients Stated Pain Goal: 5 (03/03/16 0823)               Catalina Gravel

## 2016-03-04 DIAGNOSIS — B009 Herpesviral infection, unspecified: Secondary | ICD-10-CM

## 2016-03-04 LAB — CBC
HEMATOCRIT: 31.7 % — AB (ref 36.0–46.0)
Hemoglobin: 11.3 g/dL — ABNORMAL LOW (ref 12.0–15.0)
MCH: 32 pg (ref 26.0–34.0)
MCHC: 35.6 g/dL (ref 30.0–36.0)
MCV: 89.8 fL (ref 78.0–100.0)
Platelets: 132 10*3/uL — ABNORMAL LOW (ref 150–400)
RBC: 3.53 MIL/uL — AB (ref 3.87–5.11)
RDW: 14.2 % (ref 11.5–15.5)
WBC: 18.3 10*3/uL — AB (ref 4.0–10.5)

## 2016-03-04 LAB — BIRTH TISSUE RECOVERY COLLECTION (PLACENTA DONATION)

## 2016-03-04 LAB — RPR: RPR: NONREACTIVE

## 2016-03-04 MED ORDER — OXYCODONE-ACETAMINOPHEN 5-325 MG PO TABS
1.0000 | ORAL_TABLET | ORAL | Status: DC | PRN
  Administered 2016-03-04: 2 via ORAL
  Administered 2016-03-04: 1 via ORAL
  Administered 2016-03-05 – 2016-03-06 (×3): 2 via ORAL
  Filled 2016-03-04 (×2): qty 2
  Filled 2016-03-04: qty 1
  Filled 2016-03-04 (×2): qty 2

## 2016-03-04 NOTE — Progress Notes (Signed)
MOB was referred for history of depression/anxiety. * Referral screened out by Clinical Social Worker because none of the following criteria appear to apply: ~ History of anxiety/depression during this pregnancy, or of post-partum depression. ~ Diagnosis of anxiety and/or depression within last 3 years OR * MOB's symptoms currently being treated with medication and/or therapy. Please contact the Clinical Social Worker if needs arise, or if MOB requests.   

## 2016-03-04 NOTE — Progress Notes (Addendum)
Renee Stafford [id number]  Subjective: Postpartum Day 2: Primary C/S due to breech presentation. Patient up ad lib, reports no syncope or dizziness. Feeding: Breast. Contraceptive plan: Not discussed.  Objective: Temp:  [98 F (36.7 C)-98.5 F (36.9 C)] 98.5 F (36.9 C) (09/28 0606) Pulse Rate:  [62-67] 62 (09/28 0606) Resp:  [18] 18 (09/28 0606) BP: (124-129)/(70-80) 129/70 (09/28 0606) SpO2:  [98 %] 98 % (09/27 1819)  CBC Latest Ref Rng & Units 03/05/2016 03/04/2016 03/03/2016  WBC 4.0 - 10.5 K/uL 13.5(H) 18.3(H) 14.0(H)  Hemoglobin 12.0 - 15.0 g/dL 11.1(L) 11.3(L) 13.5  Hematocrit 36.0 - 46.0 % 31.7(L) 31.7(L) 37.4  Platelets 150 - 400 K/uL 122(L) 132(L) 125(L)    Physical Exam:  General: alert, cooperative, mild distress and morbidly obese. Lochia: scant. Uterine Fundus: firm. Abdomen:  + bowel sounds, appropriately tender, non-distended. Incision: Honeycomb dressing CDI. DVT Evaluation: No evidence of DVT seen on physical exam. Negative Homan's sign. No cords or calf tenderness. Calf/Ankle edema is present.   Assessment/Plan: Status post cesarean delivery, day 2. Stable. Continue routine postop care. Gestational Thrombocytopenia; Platelets today 122, down from 132 - recheck tomorrow. Plan for discharge tomorrow if clinically stable.    Farrel Gordon, CNM 03/05/2016, 6:39 AM

## 2016-03-04 NOTE — Progress Notes (Signed)
Subjective: Postpartum Day 1: Cesarean Delivery Patient reports tolerating PO and no problems voiding. Ambulating without difficulty. Breastfeeding    Objective: Vital signs in last 24 hours: Temp:  [97.8 F (36.6 C)-98.8 F (37.1 C)] 98.6 F (37 C) (09/27 0600) Pulse Rate:  [57-82] 67 (09/27 0600) Resp:  [14-35] 20 (09/27 0600) BP: (101-122)/(50-82) 111/73 (09/27 0600) SpO2:  [95 %-100 %] 95 % (09/26 2130)  Physical Exam:  General: alert and no distress Lochia: appropriate Uterine Fundus: firm Incision: pressure dressing in place DVT Evaluation: No evidence of DVT seen on physical exam.   Recent Labs  03/03/16 0831 03/04/16 0518  HGB 13.5 11.3*  HCT 37.4 31.7*    Assessment/Plan: Status post Cesarean section. Doing well postoperatively.  Continue current care Recheck cbc in am secondary to decreased but improving plts Remove pressure dressing  Cyndy Braver Y 03/04/2016, 9:49 AM

## 2016-03-05 DIAGNOSIS — O99119 Other diseases of the blood and blood-forming organs and certain disorders involving the immune mechanism complicating pregnancy, unspecified trimester: Secondary | ICD-10-CM

## 2016-03-05 DIAGNOSIS — Z98891 History of uterine scar from previous surgery: Secondary | ICD-10-CM

## 2016-03-05 DIAGNOSIS — D696 Thrombocytopenia, unspecified: Secondary | ICD-10-CM

## 2016-03-05 LAB — CBC
HEMATOCRIT: 31.7 % — AB (ref 36.0–46.0)
HEMOGLOBIN: 11.1 g/dL — AB (ref 12.0–15.0)
MCH: 32.1 pg (ref 26.0–34.0)
MCHC: 35 g/dL (ref 30.0–36.0)
MCV: 91.6 fL (ref 78.0–100.0)
Platelets: 122 10*3/uL — ABNORMAL LOW (ref 150–400)
RBC: 3.46 MIL/uL — ABNORMAL LOW (ref 3.87–5.11)
RDW: 14.7 % (ref 11.5–15.5)
WBC: 13.5 10*3/uL — AB (ref 4.0–10.5)

## 2016-03-05 NOTE — Lactation Note (Signed)
This note was copied from a baby's chart. Lactation Consultation Note  Patient Name: Renee Stafford [id number] Date: 9/28/2017with this mom of a term baby, now 65 hours old. Mom and dad report the baby very sleepy at the breast. The baby was swaddled in her crib, and had not fed for 6 hours. Mom sleepy post c-section. I advised mom to wake baby every 3 hours and place her skin to skin, if baby not showing feeding cues. I advised mom to had express colostrum, and hold her breast through out the feeding, and stimulated baby behind her ear to elicite sucking. The baby initially would not open her mouth and latch, but one latched, she had strong suckles and great breast movement. . I had to use a cold cloth on her head ans shoulder to keep her awake at first. I advised mom to keep baby awake and sucking as long as she could, then put baby back in her crib, eat/rest her self, and if baby not awake again by 2 pm, skin to skin, attempt to latch, and call lactation as needed. I mentioned that if baby does not consistently feed, we can start a DEP later today.  Reason for consult: Follow-up assessment   Maternal Data    Feeding Feeding Type: Breast Fed  LATCH Score/Interventions Latch: Repeated attempts needed to sustain latch, nipple held in mouth throughout feeding, stimulation needed to elicit sucking reflex. Intervention(s): Adjust position;Assist with latch;Breast massage  Audible Swallowing: A few with stimulation Intervention(s): Skin to skin;Hand expression  Type of Nipple: Everted at rest and after stimulation  Comfort (Breast/Nipple): Soft / non-tender     Hold (Positioning): Assistance needed to correctly position infant at breast and maintain latch. Intervention(s): Breastfeeding basics reviewed;Support Pillows;Position options;Skin to skin  LATCH Score: 7  Lactation Tools Discussed/Used     Consult Status Consult Status: Follow-up Date: 03/06/16 Follow-up type:  In-patient    Tonna Corner 03/05/2016, 11:42 AM

## 2016-03-05 NOTE — Lactation Note (Signed)
This note was copied from a baby's chart. Lactation Consultation Note  Patient Name: Renee Stafford [id number] Date: 03/05/2016 Reason for consult: Follow-up assessment  Baby 57 hours old. Mom reports that baby nursed well earlier. Baby is now sleeping family member's chest. Mom had questions about compatibility of daily use of Claritin and breastfeeding. Discussed with mom that Claritin is L1: "Limited Data-Compatible" according to Hale's "Medications and Mothers' Milk," 2014. Enc mom to discuss with her OB and baby's pediatrician since using daily. Maternal Data    Feeding Feeding Type: Breast Fed Length of feed: 15 min  LATCH Score/Interventions Latch: Grasps breast easily, tongue down, lips flanged, rhythmical sucking. Intervention(s): Adjust position;Assist with latch;Breast compression  Audible Swallowing: A few with stimulation Intervention(s): Skin to skin Intervention(s): Skin to skin  Type of Nipple: Everted at rest and after stimulation  Comfort (Breast/Nipple): Filling, red/small blisters or bruises, mild/mod discomfort  Problem noted: Mild/Moderate discomfort Interventions (Mild/moderate discomfort):  (hand expressed breastmilk to nipples )  Hold (Positioning): No assistance needed to correctly position infant at breast. Intervention(s): Support Pillows;Breastfeeding basics reviewed;Skin to skin  LATCH Score: 8  Lactation Tools Discussed/Used     Consult Status Consult Status: Follow-up Date: 03/06/16 Follow-up type: In-patient    Andres Labrum 03/05/2016, 2:48 PM

## 2016-03-06 LAB — TYPE AND SCREEN
ABO/RH(D): O POS
Antibody Screen: NEGATIVE
UNIT DIVISION: 0
UNIT DIVISION: 0

## 2016-03-06 LAB — CBC
HEMATOCRIT: 31.1 % — AB (ref 36.0–46.0)
HEMOGLOBIN: 10.7 g/dL — AB (ref 12.0–15.0)
MCH: 31.9 pg (ref 26.0–34.0)
MCHC: 34.4 g/dL (ref 30.0–36.0)
MCV: 92.8 fL (ref 78.0–100.0)
Platelets: 122 10*3/uL — ABNORMAL LOW (ref 150–400)
RBC: 3.35 MIL/uL — ABNORMAL LOW (ref 3.87–5.11)
RDW: 14.7 % (ref 11.5–15.5)
WBC: 10.9 10*3/uL — ABNORMAL HIGH (ref 4.0–10.5)

## 2016-03-06 MED ORDER — OXYCODONE-ACETAMINOPHEN 5-325 MG PO TABS: 1.0000 | ORAL_TABLET | ORAL | 0 refills | Status: DC | PRN

## 2016-03-06 MED ORDER — ENOXAPARIN SODIUM 40 MG/0.4ML ~~LOC~~ SOLN: 40.0000 mg | SUBCUTANEOUS | 1 refills | Status: DC

## 2016-03-06 MED ORDER — IBUPROFEN 600 MG PO TABS: 600.0000 mg | ORAL_TABLET | Freq: Four times a day (QID) | ORAL | 0 refills | Status: DC

## 2016-03-06 NOTE — Discharge Summary (Signed)
[facility] Discharge Summary   Patient Name:   Renee Stafford DOB:     1986-05-10 MRN:     [id number]  Date of Admission:   03/03/2016 Date of Discharge:  03/06/2016  Admitting diagnosis:    CTX Breech Presentation Principal Problem:   S/P primary low transverse C-section Active Problems:   Morbid obesity ([place])   HSV-2 (herpes simplex virus 2) infection   Gestational thrombocytopenia without hemorrhage [facility])  Term Pregnancy Delivered    Discharge diagnosis:    CTX Breech Presentation Principal Problem:   S/P primary low transverse C-section Active Problems:   Morbid obesity ([facility])   HSV-2 (herpes simplex virus 2) infection   Gestational thrombocytopenia without hemorrhage ([facility])  Term Pregnancy Delivered      Gestational Thrombocytopenia                                                                Post partum procedures:   Type of Delivery:   Delivering Provider: Ena Dawley   Date of Delivery:  03/03/16  Newborn Data:    Live born female  Birth Weight: 8 lb 14.5 oz (4040 g) APGAR: 8, 9  Baby's Name:   Baby Feeding:   Breast Disposition:   home with mother  Complications:   None  Hospital course:      Sceduled C/S   30 y.o. yo G1P1001 at [redacted]w[redacted]d was admitted to the hospital 03/03/2016 for scheduled cesarean section with the following indication:Malpresentation.  Membrane Rupture Time/Date: 10:04 AM ,03/03/2016   Patient delivered a Viable infant.03/03/2016  Details of operation can be found in separate operative note.  Pateint had an uncomplicated postpartum course.  She is ambulating, tolerating a regular diet, passing flatus, and urinating well. Patient is discharged home in stable condition on  03/06/16          Physical Exam:  Vitals:   03/04/16 0600 03/04/16 1819 03/05/16 0606 03/05/16 1831  BP: 111/73 124/80 129/70 103/68  Pulse: 67 67 62 (!) 58  Resp: 20 18 18 19   Temp: 98.6 F (37 C) 98 F (36.7 C) 98.5 F (36.9 C) 97.9  F (36.6 C)  TempSrc: Oral Oral Oral Oral  SpO2:  98%     General: alert, cooperative and no distress Lochia: appropriate Uterine Fundus: firm Incision: Healing well with no significant drainage DVT Evaluation: No evidence of DVT seen on physical exam.  Labs:  CBC Latest Ref Rng & Units 03/06/2016 03/05/2016 03/04/2016  WBC 4.0 - 10.5 K/uL 10.9(H) 13.5(H) 18.3(H)  Hemoglobin 12.0 - 15.0 g/dL 10.7(L) 11.1(L) 11.3(L)  Hematocrit 36.0 - 46.0 % 31.1(L) 31.7(L) 31.7(L)  Platelets 150 - 400 K/uL 122(L) 122(L) 132(L)     CMP Latest Ref Rng & Units 03/03/2016 02/24/2016 10/20/2012  Glucose 65 - 99 mg/dL - 82 82  BUN 6 - 20 mg/dL - 9 16  Creatinine 0.44 - 1.00 mg/dL 0.54 0.57 0.74  Sodium 135 - 145 mmol/L - 136 136  Potassium 3.5 - 5.1 mmol/L - 3.9 4.1  Chloride 101 - 111 mmol/L - 106 104  CO2 22 - 32 mmol/L - 22 24  Calcium 8.9 - 10.3 mg/dL - 9.1 8.9  Total Protein 6.5 - 8.1 g/dL - 6.6 6.9  Total Bilirubin 0.3 -  1.2 mg/dL - 0.4 0.4  Alkaline Phos 38 - 126 U/L - 121 63  AST 15 - 41 U/L - 24 17  ALT 14 - 54 U/L - 17 16    Discharge instruction: per After Visit Summary and "Baby and Me Booklet".  After Visit Meds: Ibuprofen Percocet  Diet: routine diet  Activity: Advance as tolerated. Pelvic rest for 6 weeks.   Outpatient follow up:1 week incision check Follow up Appt:No future appointments. Follow up visit: No Follow-up on file.  Postpartum contraception: Undecided  03/06/2016 Larey Days, CNM  Addendum:   03/07/16.  Saturday.   I discharged patient 03/06/16 on lovenox 40 mg [place] Q daily to be used for six weeks due to high BMI, but on second review of her risk factors only being high BMI (53) and cesarean delivery she qualifies for lovenox use for 7 days postpartum.  I explained all this to patient and her family who are in a postpartum room boarding room as baby has jaundice.  She has already picked up a 24 day supply.  Her pharmacy is unable to accept back the extra lovenox  that was already picked up.  I explained this to patient and advised she may donate the extra lovenox to [employer] or keep it for later use.  Patient also had repeat teaching on lovenox use by RN.  She was advised to use lovenox until Tuesday next week.  DVT and PE precautions were discussed.  She expressed understanding and all her questions were answered.    Dr. Alesia Richards. 03/07/16 11:49 AM

## 2016-03-06 NOTE — Discharge Instructions (Signed)
Cesarean Delivery, Care After °Refer to this sheet in the next few weeks. These instructions provide you with information on caring for yourself after your procedure. Your health care provider may also give you specific instructions. Your treatment has been planned according to current medical practices, but problems sometimes occur. Call your health care provider if you have any problems or questions after you go home. °HOME CARE INSTRUCTIONS  °· Only take over-the-counter or prescription medications as directed by your health care provider. °· Do not drink alcohol, especially if you are breastfeeding or taking medication to relieve pain. °· Do not chew or smoke tobacco. °· Continue to use good perineal care. Good perineal care includes: °¨ Wiping your perineum from front to back. °¨ Keeping your perineum clean. °· Check your surgical cut (incision) daily for increased redness, drainage, swelling, or separation of skin. °· Clean your incision gently with soap and water every day, and then pat it dry. If your health care provider says it is okay, leave the incision uncovered. Use a bandage (dressing) if the incision is draining fluid or appears irritated. If the adhesive strips across the incision do not fall off within 7 days, carefully peel them off. °· Hug a pillow when coughing or sneezing until your incision is healed. This helps to relieve pain. °· Do not use tampons or douche until your health care provider says it is okay. °· Shower, wash your hair, and take tub baths as directed by your health care provider. °· Wear a well-fitting bra that provides breast support. °· Limit wearing support panties or control-top hose. °· Drink enough fluids to keep your urine clear or pale yellow. °· Eat high-fiber foods such as whole grain cereals and breads, brown rice, beans, and fresh fruits and vegetables every day. These foods may help prevent or relieve constipation. °· Resume activities such as climbing stairs,  driving, lifting, exercising, or traveling as directed by your health care provider. °· Talk to your health care provider about resuming sexual activities. This is dependent upon your risk of infection, your rate of healing, and your comfort and desire to resume sexual activity. °· Try to have someone help you with your household activities and your newborn for at least a few days after you leave the hospital. °· Rest as much as possible. Try to rest or take a nap when your newborn is sleeping. °· Increase your activities gradually. °· Keep all of your scheduled postpartum appointments. It is very important to keep your scheduled follow-up appointments. At these appointments, your health care provider will be checking to make sure that you are healing physically and emotionally. °SEEK MEDICAL CARE IF:  °· You are passing large clots from your vagina. Save any clots to show your health care provider. °· You have a foul smelling discharge from your vagina. °· You have trouble urinating. °· You are urinating frequently. °· You have pain when you urinate. °· You have a change in your bowel movements. °· You have increasing redness, pain, or swelling near your incision. °· You have pus draining from your incision. °· Your incision is separating. °· You have painful, hard, or reddened breasts. °· You have a severe headache. °· You have blurred vision or see spots. °· You feel sad or depressed. °· You have thoughts of hurting yourself or your newborn. °· You have questions about your care, the care of your newborn, or medications. °· You are dizzy or light-headed. °· You have a rash. °· You   have pain, redness, or swelling at the site of the removed intravenous access (IV) tube.  You have nausea or vomiting.  You stopped breastfeeding and have not had a menstrual period within 12 weeks of stopping.  You are not breastfeeding and have not had a menstrual period within 12 weeks of delivery.  You have a fever. SEEK  IMMEDIATE MEDICAL CARE IF:  You have persistent pain.  You have chest pain.  You have shortness of breath.  You faint.  You have leg pain.  You have stomach pain.  Your vaginal bleeding saturates 2 or more sanitary pads in 1 hour. MAKE SURE YOU:   Understand these instructions.  Will watch your condition.  Will get help right away if you are not doing well or get worse.   This information is not intended to replace advice given to you by your health care provider. Make sure you discuss any questions you have with your health care provider.   Document Released: 02/14/2002 Document Revised: 06/15/2014 Document Reviewed: 01/20/2012 Elsevier Interactive Patient Education 2016 [employer]. Thrombocytopenia Thrombocytopenia is a condition in which there is an abnormally small number of platelets in your blood. Platelets are also called thrombocytes. Platelets are needed for blood clotting. CAUSES Thrombocytopenia is caused by:   Decreased production of platelets. This can be caused by:  Aplastic anemia in which your bone marrow quits making blood cells.  Cancer in the bone marrow.  Use of certain medicines, including chemotherapy.  Infection in the bone marrow.  Heavy alcohol consumption.  Increased destruction of platelets. This can be caused by:  Certain immune diseases.  Use of certain drugs.  Certain blood clotting disorders.  Certain inherited disorders.  Certain bleeding disorders.  Pregnancy.  Having an enlarged spleen (hypersplenism). In hypersplenism, the spleen gathers up platelets from circulation. This means the platelets are not available to help with blood clotting. The spleen can enlarge due to cirrhosis or other conditions. SYMPTOMS  The symptoms of thrombocytopenia are side effects of poor blood clotting. Some of these are:  Abnormal bleeding.  Nosebleeds.  Heavy menstrual periods.  Blood in the urine or stools.  Purpura. This is a  purplish discoloration in the skin produced by small bleeding vessels near the surface of the skin.  Bruising.  A rash that may be petechial. This looks like pinpoint, purplish-red spots on the skin and mucous membranes. It is caused by bleeding from small blood vessels (capillaries). DIAGNOSIS  Your caregiver will make this diagnosis based on your exam and blood tests. Sometimes, a bone marrow study is done to look for the original cells (megakaryocytes) that make platelets. TREATMENT  Treatment depends on the cause of the condition.  Medicines may be given to help protect your platelets from being destroyed.  In some cases, a replacement (transfusion) of platelets may be required to stop or prevent bleeding.  Sometimes, the spleen must be surgically removed. HOME CARE INSTRUCTIONS   Check the skin and linings inside your mouth for bruising or bleeding as directed by your caregiver.  Check your sputum, urine, and stool for blood as directed by your caregiver.  Do not return to any activities that could cause bumps or bruises until your caregiver says it is okay.  Take extra care not to cut yourself when shaving or when using scissors, needles, knives, and other tools.  Take extra care not to burn yourself when ironing or cooking.  Ask your caregiver if it is okay for you to drink  alcohol.  Only take over-the-counter or prescription medicines as directed by your caregiver.  Notify all your caregivers, including dentists and eye doctors, about your condition. SEEK IMMEDIATE MEDICAL CARE IF:   You develop active bleeding from anywhere in your body.  You develop unexplained bruising or bleeding.  You have blood in your sputum, urine, or stool. MAKE SURE YOU:  Understand these instructions.  Will watch your condition.  Will get help right away if you are not doing well or get worse.   This information is not intended to replace advice given to you by your health care  provider. Make sure you discuss any questions you have with your health care provider.   Document Released: 05/25/2005 Document Revised: 08/17/2011 Document Reviewed: 11/26/2014 Elsevier Interactive Patient Education [employer].

## 2016-03-06 NOTE — Lactation Note (Signed)
This note was copied from a baby's chart. Lactation Consultation Note Follow up visit at 65 hours of age.  Charge Rn, Anderson Malta,  reports baby will stay as a baby patient and begin photo therpy for increase bilirubin levels.  Mom reports pumping about 29mls and baby tolerated supplement with bottle well.  Baby now asleep in crib.  LC discussed feeding plan with mom, FOB and MGM.  LC also left written instructions for mom to follow.  Mom will wake baby for feedings every 2 1/2-3 hours.  Mom will supplement with EBM or formula for 30-55mls, appropriate for current hours of age,  with each feeding. Mom will post pump and work on hand expression.   LC discussed supply and demand and normal increase in milk volume and maybe taking longer for her baby and due to moms increased edema.   Report given to [facility] RN, Hager Compston Half.  Mom to call for assist as needed.      Patient Name: Renee Stafford [id number] Date: 03/06/2016 Reason for consult: Follow-up assessment   Maternal Data    Feeding Feeding Type: Bottle Fed - Breast Milk Length of feed: 8 min  LATCH Score/Interventions Latch: Repeated attempts needed to sustain latch, nipple held in mouth throughout feeding, stimulation needed to elicit sucking reflex. Intervention(s): Adjust position;Assist with latch;Breast massage;Breast compression  Audible Swallowing: A few with stimulation Intervention(s): Skin to skin;Hand expression  Type of Nipple: Everted at rest and after stimulation  Comfort (Breast/Nipple): Filling, red/small blisters or bruises, mild/mod discomfort  Problem noted: Mild/Moderate discomfort Interventions (Mild/moderate discomfort): Hand expression;Post-pump  Hold (Positioning): No assistance needed to correctly position infant at breast. Intervention(s): Breastfeeding basics reviewed;Support Pillows;Position options;Skin to skin  LATCH Score: 7  Lactation Tools Discussed/Used Pump Review: Setup, frequency, and cleaning;Milk  Storage Initiated by:: JS Date initiated:: 03/06/16   Consult Status Consult Status: Follow-up Date: 03/06/16 Follow-up type: In-patient    Renee Stafford, [name] 03/06/2016, 11:39 AM

## 2016-03-06 NOTE — Lactation Note (Signed)
This note was copied from a baby's chart. Lactation Consultation Note follow up visit at 52 hours of age.  Mom reports baby has been sleepy and fussy, mom is complaining of sore nipples.  Baby is crying when Renee Stafford entered the room, mom has baby swaddled and attempting to latch with cradle hold.  Lc encouraged mom to hold baby STS to calm baby first.  Mom is not comfortable with cross cradle.  Lc instructed on option of positioning, but encouraged mom to work on football hold as she has been trying,  to be more independent with latching. Mom allows baby a very shallow latch at tip of nipple, baby sucks briefly and stops, holding breast in mouth.  Waking techniques attempted and discussed, baby was sleepy and not eager to eat.  FOB reports last feeding 5 hours previous.   Lc discussed pumping with mom and she reports using a hand pump and supplemented with bottle once during the night with 25mls EBM. [place] left room to get pump and supplies and when LC returned baby was latched well with wide flanged mouth and strong sucking intermittently.  Baby maintained feeding for about 8 minutes with stimulation.   Moms left nipple is slightly compressed with small red blister on tip, right nipple has compression stripe bruising noted.  LC encouraged mom to hand express pre feedings and after to apply colostrum to nipple. Lc encouraged mom to work on latch technique and positioning with depth to help prevent further nipple trauma.   Lc encouraged mom to post pump with DEBP, Mom shown how to use DEBP & how to disassemble, clean, & reassemble parts. Baby has had 7 feedings with supplement of 37mls, 3 voids and 3 stool smears in the past 24 hours.  Baby has had LATCH scores of "6-8".  FOB and MGM at bedside supportive.  Mom has a friend that is a Science writer and family members that do home post partum visits.  LC to follow up after pumping to work on a discharge plan.    Patient Name: Renee Stafford [id number] Date:  03/06/2016 Reason for consult: Follow-up assessment   Maternal Data    Feeding Feeding Type: Breast Fed Length of feed: 8 min  LATCH Score/Interventions Latch: Repeated attempts needed to sustain latch, nipple held in mouth throughout feeding, stimulation needed to elicit sucking reflex. Intervention(s): Adjust position;Assist with latch;Breast massage;Breast compression  Audible Swallowing: A few with stimulation Intervention(s): Skin to skin;Hand expression  Type of Nipple: Everted at rest and after stimulation  Comfort (Breast/Nipple): Filling, red/small blisters or bruises, mild/mod discomfort  Problem noted: Mild/Moderate discomfort Interventions (Mild/moderate discomfort): Hand expression;Post-pump  Hold (Positioning): No assistance needed to correctly position infant at breast. Intervention(s): Breastfeeding basics reviewed;Support Pillows;Position options;Skin to skin  LATCH Score: 7  Lactation Tools Discussed/Used Pump Review: Setup, frequency, and cleaning;Milk Storage Initiated by:: JS Date initiated:: 03/06/16   Consult Status Consult Status: Follow-up Date: 03/06/16 Follow-up type: In-patient    Renee Stafford, Renee Stafford 03/06/2016, 10:35 AM

## 2016-03-07 ENCOUNTER — Ambulatory Visit: Payer: Self-pay

## 2016-03-07 NOTE — Lactation Note (Signed)
This note was copied from a baby's chart. Lactation Consultation Note  Mother requested NS because baby liked the bottle "better".  Explained that an assessment would need to be done at the next feeding which was at 1530.  Parents agreed to this plan. Follow-up at 1530.  Patient Name: Renee Stafford [id number] Date: 03/07/2016 Reason for consult: Follow-up assessment   Maternal Data    Feeding    LATCH Score/Interventions                      Lactation Tools Discussed/Used     Consult Status Consult Status: Follow-up (if still here 10/1) Date: 03/08/16 Follow-up type: In-patient    Van Clines 03/07/2016, 4:11 PM

## 2016-03-07 NOTE — Lactation Note (Signed)
This note was copied from a baby's chart. Lactation Consultation Note  Patient Name: Renee Stafford [id number] Date: 03/07/2016 Reason for consult: Follow-up assessment Infant is 28 days old & seen by Lactation for follow-up assessment. Baby was with visitor when [facility] entered & mom had just pumped ~49mL. Mom reports that BF is not going very well because baby either spits her nipple out or she falls asleep when at the breast. Mom reports that she had planned to just pump anyway so that is what she will most likely do. Mom has Medela Pump 'n Style at home; encouraged mom to continue pumping every 3 hrs for 15-20 mins followed by ~5 mins of hand expressing. Discussed how some moms are unable to make enough milk by pumping only (no BF) so discussed option of continuing to work on BF as well as skin-to-skin. Encouraged mom to ask for help at future feeds if she wants to continue trying to latch. Mom reports having sore nipples; encouraged mom to continue using EBM on nipples after BF or pumping to help with healing. Discussed amount baby is drinking currently & appropriate increases in volume as baby grows.  Reminded mom of Outpatient lactation number and for her to call if she has any questions. Mom reports no questions at this time.  Maternal Data    Feeding Feeding Type: Breast Milk with Formula added  LATCH Score/Interventions                      Lactation Tools Discussed/Used     Consult Status Consult Status: Follow-up (if still here 10/1) Date: 03/08/16 Follow-up type: In-patient    Yvonna Alanis 03/07/2016, 12:42 PM

## 2016-03-13 ENCOUNTER — Ambulatory Visit (HOSPITAL_COMMUNITY)
Admission: RE | Admit: 2016-03-13 | Discharge: 2016-03-13 | Disposition: A | Discharge status: Home / Self Care | Payer: BLUE CROSS/BLUE SHIELD | Source: Ambulatory Visit | Attending: Obstetrics and Gynecology | Admitting: Obstetrics and Gynecology

## 2016-03-13 NOTE — Lactation Note (Signed)
Lactation Consult  Mother's reason for visit:  Baby not latching, Mom wants help with latch Visit Type:  Outpatient Appointment Notes:  Mom here for help with latch. Baby did not latch well in hospital, would stay on breast 4-5 minutes. Mom has been pump/bottle feeding. Consult:  Initial Lactation Consultant:  Katrine Coho  ________________________________________________________________________  87 Name:  Renee Stafford Date of Birth:  03/03/2016 Pediatrician:  [place] Gender:  female Gestational Age: [redacted]w[redacted]d (At Birth) Birth Weight:  8 lb 14.5 oz (4040 g) Weight at Discharge:  Weight: 8 lb 1.6 oz (3674 g)               Date of Discharge:  03/07/2016      [facility] Weights   03/04/16 2300 03/06/16 0026 03/06/16 2344  Weight: 8 lb 3.4 oz (3725 g) 8 lb (3629 g) 8 lb 1.6 oz (3674 g)  Last weight taken from location outside of [facility]:  03/09/16  8 lb. Oz.     Location:[facility] Weight today:  8 lb. 4.2 oz/3748 gm.   ________________________________________________________________________  Mother's Name: Renee Stafford Type of delivery:  C/S Breastfeeding Experience:  P1 Maternal Medical Conditions:  Pregnancy induced hypertension Maternal Medications:  PNV, Ibuprofen, med for HTN- Mom could not give name  ________________________________________________________________________  Breastfeeding History (Post Discharge)  Frequency of breastfeeding:  Not latching Duration of feeding:  N/A  Supplementation Mom is giving baby 60-90 ml of EBM/formula as needed every 3 hours.   Method:  Bottle  Pumping  Type of pump:  Medela pump in style Frequency:  Every 3 hours Volume:  45-50 ml ,  Infant Intake and Output Assessment  Voids:  8-10 in 24 hrs.  Color:  Clear yellow Stools:  6-7 in 24 hrs.  Color:  Brown  ________________________________________________________________________  Maternal Breast Assessment  Breast:  Soft Nipple:  Erect Pain level:   0 Pain interventions:  N/A  _______________________________________________________________________ Feeding Assessment/Evaluation  Initial feeding assessment:  Infant's oral assessment:  WNL  Assisted Mom with using breast compression to latch baby to Left breast in foot ball hold. Baby demonstrated good suckling bursts off/on, swallows noted but became sleepy with increasing non-nutritive suckles after about 10 minutes. Baby stayed at breast for a total of 13 minutes and transferred 18 ml per pre/post weight.   On right breast assisted Mom to latch baby using breast compression in cross cradle. Baby nursed for 10 minutes off/on but very sleepy at breast. Only transferred 6 ml at breast. Changed to football hold.  Initiated 5 fr feeding tube/syringe at breast with EBM to see if this would keep baby be more engaged at breast, baby nursed an additional 10 minutes and took 8 ml of EBM. All from feeding tube/syringe system per pre/post weight.  LC then gave baby the remaining 2 ml of EBM with finger feeding. Total amount = 10 ml.  Mom finished the feeding of EBM by giving baby 50 ml via bottle.   Total transfer at breast = 34 ml Total given via bottle = 50 ml.  Feeding plan discussed with parents: Start putting baby to breast each feeding.  Pre-pump for 3-5 minutes to stimulate milk flow Latch baby to breast and use 5 fr feeding tube system to supplement. Try to keep baby active for 15-20 minutes. Then do the same on 2nd breast.  This would be about 20 ml of supplement while at breast if baby takes this well.  Hopefully if baby has more consistent  milk flow while at breast she will stay engaged for longer periods. Also this should improve as her jaundice improves.  Mom to complete supplement via bottle - approx 60 ml each feeding till we can determine baby is transferring milk better. Mom to post pump for 15 minutes to support milk supply.  Mom has family members at home to assist with  bottle supplements while she pumps.  Mom may just pump/bottle at night to get more rest.  Encouraged to start Lactation Support by Thomas Hoff to support her milk production.  OP f/u with lactation for Wednesday, 03/18/16 at 1:00.

## 2016-03-18 ENCOUNTER — Ambulatory Visit (HOSPITAL_COMMUNITY)
Admission: RE | Admit: 2016-03-18 | Discharge: 2016-03-18 | Disposition: A | Discharge status: Home / Self Care | Payer: BLUE CROSS/BLUE SHIELD | Source: Ambulatory Visit | Attending: Obstetrics and Gynecology | Admitting: Obstetrics and Gynecology

## 2016-03-18 NOTE — Lactation Note (Signed)
Lactation Consult for Renee Stafford (mother) and Renee Stafford (DOB: [birth year])  Consult:  F/u from 03-13-16 Lactation Consultant:  Larkin Ina  ________________________________________________________________________ BW: 4696E (8# 14.5oz) 03-06-16: 8# (down 10%) 03-09-16: 8# 2.2 oz (down 8.8%) 03-13-16: 3748g, 8# 4.2 oz (LC appt) 03-17-16: 8# 12 oz Today's weight: 3994g (1.1% below BW) ________________________________________________________________________  Mother's Name: Renee Stafford Type of delivery:  C/S Breastfeeding Experience: primip Maternal Medical Conditions:  Pregnancy induced hypertension Maternal Medications: PNV, diuretic, stool softener  ________________________________________________________________________  Breastfeeding History (Post Discharge)  Frequency of breastfeeding: tid-qid Duration of feeding: 5-10  Pumping  Type of pump:  Medela pump in style Frequency:  q3-4h for 20-25 Volume:  2-4 oz/session  Bottle: 7-8+ bottles/day, 3-4oz/bottle  Infant Intake and Output Assessment  Voids: 8-10 in 24 hrs.  Color:  Clear yellow Stools: 6-7 in 24 hrs.  Color:  Brown  ________________________________________________________________________  Maternal Breast Assessment  Breast:  Compressible Nipple:  Erect   _______________________________________________________________________ Feeding Assessment/Evaluation  Initial feeding assessment:  Infant's oral assessment:  WNL  Attached assessment:  Deep  Lips flanged:  Yes.     Suck assessment:  Nutritive  Pre-feed weight: 3994 g   Post-feed weight: 4002 g  Amount transferred: 8 ml  Pre-feed weight: 4002 g   Post-feed weight: 4018 g  Amount transferred: 16 ml 11 minutes  Pre-feed weight: 4010 g  (diaper changed between 2nd and 3rd feeding) Post-feed weight: 4014 g  Amount transferred: 4 ml 10 minutes  Total amount transferred: 28 ml Total supplement given: 70 ml  Renee Stafford is  48 weeks old and is almost back to BW. Infant's caloric needs are met by EBM or formula in a bottle. Mother is not assertive about latching infant & as a result, infant does not latch. I spent time showing Mom specifics of an asymmetric latch shown via [employer] & also importance of hand placement for breastfeeding, as well as reassuring about infant's ability to breathe. Mother did not show any improvement in latching infant during consult, although infant latched w/ease 3 times during consult w/LC assist using the teacup hold. Despite adequate latching, only small amounts of milk were transferred w/each trip to the breast. On oral exam, infant's suction was moderate, but not very strong. As a result, paced bottle-feeding was taught w/an emphasis on "playing tug-of-war" to increase infant's suction when she is at the breast.   Mom can sometimes pump up to 4 oz of EBM w/pumping. Yet, Mom tends to let 7-9 hours go by each evening without expressing her milk/stimulating her breasts. I made Mom aware that such long lengths of time will drive down her supply & she needed to consider if she preferred to protect her milk supply or protect her sleep (Mom has a history of anxiety/depression).   I encouraged Mom to continue offering breast with latch techniques demonstrated during consult and to f/u with a bottle. I encouraged Mom not to take fenugreek for milk supply, as she has asthma.  Elinor Dodge, RN, IBCLC

## 2016-08-25 ENCOUNTER — Ambulatory Visit (INDEPENDENT_AMBULATORY_CARE_PROVIDER_SITE_OTHER): Payer: Managed Care, Other (non HMO) | Admitting: Physician Assistant

## 2016-08-25 VITALS — BP 118/80 | HR 78 | Temp 98.9°F | Resp 16 | Ht 63.0 in | Wt 269.0 lb

## 2016-08-25 DIAGNOSIS — R05 Cough: Secondary | ICD-10-CM

## 2016-08-25 DIAGNOSIS — J4521 Mild intermittent asthma with (acute) exacerbation: Secondary | ICD-10-CM | POA: Diagnosis not present

## 2016-08-25 DIAGNOSIS — R062 Wheezing: Secondary | ICD-10-CM | POA: Diagnosis not present

## 2016-08-25 DIAGNOSIS — R059 Cough, unspecified: Secondary | ICD-10-CM

## 2016-08-25 MED ORDER — PREDNISONE 20 MG PO TABS: ORAL_TABLET | ORAL | 0 refills | Status: AC

## 2016-08-25 MED ORDER — ALBUTEROL SULFATE (2.5 MG/3ML) 0.083% IN NEBU
2.5000 mg | INHALATION_SOLUTION | Freq: Once | RESPIRATORY_TRACT | Status: AC
  Administered 2016-08-25: 2.5 mg via RESPIRATORY_TRACT

## 2016-08-25 MED ORDER — IPRATROPIUM BROMIDE 0.02 % IN SOLN
0.5000 mg | Freq: Once | RESPIRATORY_TRACT | Status: AC
  Administered 2016-08-25: 0.5 mg via RESPIRATORY_TRACT

## 2016-08-25 MED ORDER — HYDROCODONE-HOMATROPINE 5-1.5 MG/5ML PO SYRP: 2.5000 mL | ORAL_SOLUTION | Freq: Every day | ORAL | 0 refills | Status: AC

## 2016-08-25 NOTE — Progress Notes (Signed)
08/30/2016 3:12 PM   DOB: 01-Jul-1985 / MRN: [id number]  SUBJECTIVE:  Renee Stafford is a 31 y.o. female with a PMH of ashtma presenting for URI symptoms that started about 1 week ago.  This started with sinus congestion which has continued and progressed to chest congestion.  She has a history of asthma that was diagnosed five years ago.  She has an infant with her but denies any breast feeding at this time. She has tried theraflu and this has helped some. She feels that she is getting somewhat better.   She is allergic to cholestatin.   She  has a past medical history of Anemia; Anxiety; Asthma; Depression; Dysmenorrhea; Dyspareunia; Fibroid; Herpes; varicella; STD (sexually transmitted disease); and Vitamin D deficiency.    She  reports that she has never smoked. She has never used smokeless tobacco. She reports that she drinks about 0.6 oz of alcohol per week . She reports that she does not use drugs. She  reports that she currently engages in sexual activity and has had female partners. She reports using the following method of birth control/protection: None. The patient  has a past surgical history that includes Tonsillectomy (1991) and Cesarean section (N/A, 03/03/2016).  Her family history includes Asthma in her mother; Breast cancer (age of onset: 64) in her maternal aunt; Diabetes in her maternal grandmother; Hyperlipidemia in her father; Hypertension in her father; Stroke in her maternal grandmother.  Review of Systems  Constitutional: Positive for fever (subjective) and malaise/fatigue. Negative for chills and weight loss.  Cardiovascular: Negative for chest pain.  Skin: Negative for itching and rash.  Neurological: Negative for dizziness and weakness.    The problem list and medications were reviewed and updated by myself where necessary and exist elsewhere in the encounter.   OBJECTIVE:  BP 118/80   Pulse 78   Temp 98.9 F (37.2 C) (Oral)   Resp 16   Ht 5\' 3"  (1.6 m)   Wt  269 lb (122 kg)   SpO2 98%   BMI 47.65 kg/m   Physical Exam  Constitutional: She is oriented to person, place, and time. She appears well-developed and well-nourished. No distress.  Cardiovascular: Normal rate and regular rhythm.   Pulmonary/Chest: Effort normal. No respiratory distress. She has wheezes. She has no rales. She exhibits no tenderness.  Musculoskeletal: Normal range of motion. She exhibits no edema, tenderness or deformity.  Neurological: She is alert and oriented to person, place, and time.  Skin: Skin is warm and dry. She is not diaphoretic.    No results found for this or any previous visit (from the past 72 hour(s)).  No results found.  ASSESSMENT AND PLAN:  Renee Stafford was seen today for nasal congestion, cough and fatigue.  Diagnoses and all orders for this visit:  Cough: See problem three. -     HYDROcodone-homatropine (HYCODAN) 5-1.5 MG/5ML syrup; Take 2.5-5 mLs by mouth at bedtime.  Wheezing: See problem three.  -     albuterol (PROVENTIL) (2.5 MG/3ML) 0.083% nebulizer solution 2.5 mg; Take 3 mLs (2.5 mg total) by nebulization once. -     ipratropium (ATROVENT) nebulizer solution 0.5 mg; Take 2.5 mLs (0.5 mg total) by nebulization once.  Mild intermittent asthma with acute exacerbation: Wheezing worsening with 1st neb and improved with second albuterol only.  Starting her on pred. She has an inhaler at home and albuterol nebs.  Advised she take a neb every 4-6 hours for the next 24 hours or so. Start pred  tonight.  If wheezing persist or worsening RTC or go to the ED.  Patient voiced understanding.  -     predniSONE (DELTASONE) 20 MG tablet; Take 3 in the morning for 3 days, then 2 in the morning for 3 days, and then 1 in the morning for 3 days.    The patient is advised to call or return to clinic if she does not see an improvement in symptoms, or to seek the care of the closest emergency department if she worsens with the above plan.   Philis Fendt, MHS,  PA-C [facility] 08/30/2016 3:12 PM

## 2016-08-25 NOTE — Patient Instructions (Signed)
     IF you received an x-ray today, you will receive an invoice from [facility]. Please contact [facility] at [phone] with questions or concerns regarding your invoice.   IF you received labwork today, you will receive an invoice from [facility]. Please contact [facility] at [phone] with questions or concerns regarding your invoice.   Our billing staff will not be able to assist you with questions regarding bills from these companies.  You will be contacted with the lab results as soon as they are available. The fastest way to get your results is to activate your My Chart account. Instructions are located on the last page of this paperwork. If you have not heard from us regarding the results in 2 weeks, please contact this office.     

## 2016-10-26 ENCOUNTER — Encounter: Payer: Self-pay | Admitting: Urgent Care

## 2016-10-26 ENCOUNTER — Ambulatory Visit (INDEPENDENT_AMBULATORY_CARE_PROVIDER_SITE_OTHER): Payer: Managed Care, Other (non HMO) | Admitting: Urgent Care

## 2016-10-26 VITALS — BP 148/84 | HR 71 | Temp 98.3°F | Resp 18 | Ht 63.0 in | Wt 275.0 lb

## 2016-10-26 DIAGNOSIS — J9801 Acute bronchospasm: Secondary | ICD-10-CM

## 2016-10-26 DIAGNOSIS — R062 Wheezing: Secondary | ICD-10-CM

## 2016-10-26 DIAGNOSIS — R030 Elevated blood-pressure reading, without diagnosis of hypertension: Secondary | ICD-10-CM

## 2016-10-26 DIAGNOSIS — R059 Cough, unspecified: Secondary | ICD-10-CM

## 2016-10-26 DIAGNOSIS — R0989 Other specified symptoms and signs involving the circulatory and respiratory systems: Secondary | ICD-10-CM | POA: Diagnosis not present

## 2016-10-26 DIAGNOSIS — Z9109 Other allergy status, other than to drugs and biological substances: Secondary | ICD-10-CM

## 2016-10-26 DIAGNOSIS — R05 Cough: Secondary | ICD-10-CM | POA: Diagnosis not present

## 2016-10-26 MED ORDER — MONTELUKAST SODIUM 10 MG PO TABS: 10.0000 mg | ORAL_TABLET | Freq: Every day | ORAL | 3 refills | Status: AC

## 2016-10-26 MED ORDER — AZITHROMYCIN 250 MG PO TABS: ORAL_TABLET | ORAL | 0 refills | Status: DC

## 2016-10-26 MED ORDER — IPRATROPIUM BROMIDE 0.02 % IN SOLN
0.5000 mg | Freq: Once | RESPIRATORY_TRACT | Status: AC
  Administered 2016-10-26: 0.5 mg via RESPIRATORY_TRACT

## 2016-10-26 MED ORDER — ALBUTEROL SULFATE (2.5 MG/3ML) 0.083% IN NEBU
2.5000 mg | INHALATION_SOLUTION | Freq: Once | RESPIRATORY_TRACT | Status: AC
  Administered 2016-10-26: 2.5 mg via RESPIRATORY_TRACT

## 2016-10-26 MED ORDER — HYDROCODONE-HOMATROPINE 5-1.5 MG/5ML PO SYRP: 5.0000 mL | ORAL_SOLUTION | Freq: Every evening | ORAL | 0 refills | Status: DC | PRN

## 2016-10-26 NOTE — Patient Instructions (Addendum)
Asthma, Adult Asthma is a recurring condition in which the airways tighten and narrow. Asthma can make it difficult to breathe. It can cause coughing, wheezing, and shortness of breath. Asthma episodes, also called asthma attacks, range from minor to life-threatening. Asthma cannot be cured, but medicines and lifestyle changes can help control it. What are the causes? Asthma is believed to be caused by inherited (genetic) and environmental factors, but its exact cause is unknown. Asthma may be triggered by allergens, lung infections, or irritants in the air. Asthma triggers are different for each person. Common triggers include:  Animal dander.  Dust mites.  Cockroaches.  Pollen from trees or grass.  Mold.  Smoke.  Air pollutants such as dust, household cleaners, hair sprays, aerosol sprays, paint fumes, strong chemicals, or strong odors.  Cold air, weather changes, and winds (which increase molds and pollens in the air).  Strong emotional expressions such as crying or laughing hard.  Stress.  Certain medicines (such as aspirin) or types of drugs (such as beta-blockers).  Sulfites in foods and drinks. Foods and drinks that may contain sulfites include dried fruit, potato chips, and sparkling grape juice.  Infections or inflammatory conditions such as the flu, a cold, or an inflammation of the nasal membranes (rhinitis).  Gastroesophageal reflux disease (GERD).  Exercise or strenuous activity.  What are the signs or symptoms? Symptoms may occur immediately after asthma is triggered or many hours later. Symptoms include:  Wheezing.  Excessive nighttime or early morning coughing.  Frequent or severe coughing with a common cold.  Chest tightness.  Shortness of breath.  How is this diagnosed? The diagnosis of asthma is made by a review of your medical history and a physical exam. Tests may also be performed. These may include:  Lung function studies. These tests show how  much air you breathe in and out.  Allergy tests.  Imaging tests such as X-rays.  How is this treated? Asthma cannot be cured, but it can usually be controlled. Treatment involves identifying and avoiding your asthma triggers. It also involves medicines. There are 2 classes of medicine used for asthma treatment:  Controller medicines. These prevent asthma symptoms from occurring. They are usually taken every day.  Reliever or rescue medicines. These quickly relieve asthma symptoms. They are used as needed and provide short-term relief.  Your health care provider will help you create an asthma action plan. An asthma action plan is a written plan for managing and treating your asthma attacks. It includes a list of your asthma triggers and how they may be avoided. It also includes information on when medicines should be taken and when their dosage should be changed. An action plan may also involve the use of a device called a peak flow meter. A peak flow meter measures how well the lungs are working. It helps you monitor your condition. Follow these instructions at home:  Take medicines only as directed by your health care provider. Speak with your health care provider if you have questions about how or when to take the medicines.  Use a peak flow meter as directed by your health care provider. Record and keep track of readings.  Understand and use the action plan to help minimize or stop an asthma attack without needing to seek medical care.  Control your home environment in the following ways to help prevent asthma attacks: ? Do not smoke. Avoid being exposed to secondhand smoke. ? Change your heating and air conditioning filter regularly. ? Limit   your use of fireplaces and wood stoves. ? Get rid of pests (such as roaches and mice) and their droppings. ? Throw away plants if you see mold on them. ? Clean your floors and dust regularly. Use unscented cleaning products. ? Try to have someone  else vacuum for you regularly. Stay out of rooms while they are being vacuumed and for a short while afterward. If you vacuum, use a dust mask from a hardware store, a double-layered or microfilter vacuum cleaner bag, or a vacuum cleaner with a HEPA filter. ? Replace carpet with wood, tile, or vinyl flooring. Carpet can trap dander and dust. ? Use allergy-proof pillows, mattress covers, and box spring covers. ? Wash bed sheets and blankets every week in hot water and dry them in a dryer. ? Use blankets that are made of polyester or cotton. ? Clean bathrooms and kitchens with bleach. If possible, have someone repaint the walls in these rooms with mold-resistant paint. Keep out of the rooms that are being cleaned and painted. ? Wash hands frequently. Contact a health care provider if:  You have wheezing, shortness of breath, or a cough even if taking medicine to prevent attacks.  The colored mucus you cough up (sputum) is thicker than usual.  Your sputum changes from clear or white to yellow, green, gray, or bloody.  You have any problems that may be related to the medicines you are taking (such as a rash, itching, swelling, or trouble breathing).  You are using a reliever medicine more than 2-3 times per week.  Your peak flow is still at 50-79% of your personal best after following your action plan for 1 hour.  You have a fever. Get help right away if:  You seem to be getting worse and are unresponsive to treatment during an asthma attack.  You are short of breath even at rest.  You get short of breath when doing very little physical activity.  You have difficulty eating, drinking, or talking due to asthma symptoms.  You develop chest pain.  You develop a fast heartbeat.  You have a bluish color to your lips or fingernails.  You are light-headed, dizzy, or faint.  Your peak flow is less than 50% of your personal best. This information is not intended to replace advice given to  you by your health care provider. Make sure you discuss any questions you have with your health care provider. Document Released: 05/25/2005 Document Revised: 11/06/2015 Document Reviewed: 12/22/2012 Elsevier Interactive Patient Education  2017 Elsevier Inc.     IF you received an x-ray today, you will receive an invoice from [facility]. Please contact [facility] at [phone] with questions or concerns regarding your invoice.   IF you received labwork today, you will receive an invoice from [facility]. Please contact [facility] at [phone] with questions or concerns regarding your invoice.   Our billing staff will not be able to assist you with questions regarding bills from these companies.  You will be contacted with the lab results as soon as they are available. The fastest way to get your results is to activate your My Chart account. Instructions are located on the last page of this paperwork. If you have not heard from us regarding the results in 2 weeks, please contact this office.     

## 2016-10-26 NOTE — Progress Notes (Signed)
  MRN: [id number] DOB: [birth year]  Subjective:   Renee Stafford is a 31 y.o. female presenting for chief complaint of URI (chest congestion    pt had a cold last week ) and Cough (yellow mucus )  Reports 1 week history of productive cough, chest congestion. Started out with scratchy and sore throat, sinus congestion, sinus pain. These symptoms are now improved. Has been consistent with Flonase and Claritin. She does have a 63 month old that attends day care and feels that there is a constant back and forth with illnesses between them. Admits history of asthma, wheezing. Uses albuterol inhaler 1-2 times daily. Denies fever, current sinus pain, ear pain, ear drainage, current sore throat, chest pain, shob, wheezing, n/v, abdominal pain, rashes. Denies smoking cigarettes. Patient is not breastfeeding.  Renee Stafford has a current medication list which includes the following prescription(s): fluticasone, loratadine, and sertraline. Also is allergic to cholestatin. Renee Stafford  has a past medical history of Anemia; Anxiety; Asthma; Depression; Dysmenorrhea; Dyspareunia; Fibroid; Herpes; varicella; STD (sexually transmitted disease); and Vitamin D deficiency. Also  has a past surgical history that includes Tonsillectomy (1991) and Cesarean section (N/A, 03/03/2016).  Objective:   Vitals: BP (!) 148/84 (BP Location: Right Arm, Patient Position: Sitting, Cuff Size: Large)   Pulse 71   Temp 98.3 F (36.8 C) (Oral)   Resp 18   Ht 5\' 3"  (1.6 m)   Wt 275 lb (124.7 kg)   LMP 10/19/2016   SpO2 99%   BMI 48.71 kg/m   BP Readings from Last 3 Encounters:  10/26/16 (!) 148/84  08/25/16 118/80  03/06/16 126/70   Physical Exam  Constitutional: She is oriented to person, place, and time. She appears well-developed and well-nourished.  HENT:  TM's intact bilaterally, no effusions or erythema. Nasal turbinates pink and moist, nasal passages patent. No sinus tenderness. Oropharynx with minimal post-nasal  drianage, mucous membranes moist.  Eyes: Right eye exhibits no discharge. Left eye exhibits no discharge.  Neck: Normal range of motion. Neck supple.  Cardiovascular: Normal rate, regular rhythm and intact distal pulses.  Exam reveals no gallop and no friction rub.   No murmur heard. Pulmonary/Chest: No respiratory distress. She has wheezes (mid-lower lung fields). She has no rales.  Lymphadenopathy:    She has no cervical adenopathy.  Neurological: She is alert and oriented to person, place, and time.  Skin: Skin is warm and dry.  Psychiatric: She has a normal mood and affect.   Assessment and Plan :   1. Bronchospasm 2. Wheezing 3. Cough 4. Chest congestion - Will start Singulair, schedule albuterol inhaler 2-3 times daily. Use cough suppression medications. Use azithromycin script if no improvement in 3-4 days.  5. Environmental allergies - Maintain allergy medications.  6. Elevated blood pressure reading - Monitor  Jaynee Eagles, [facility] [phone] 10/26/2016  5:35 PM

## 2016-11-10 NOTE — Progress Notes (Signed)
GYNECOLOGY  VISIT   HPI: 31 y.o.   Married  Caucasian  female   G1P1001 with Patient's last menstrual period was 10/19/2016.   here for spotting between cycles.  Old blood one week after her cycle and it lasted 5 days.  Developed cramping also.  Bleeding has now stopped.  Last bleeding was  4 - 5 days ago.   Cycles since delivery have been every 21 - 28 days. Painful and heavy menses.  Pad change up to every hour. Hx fibroids - had 3 subserosal fibroid measuring less than 3 cm on 03/03/16 at the time of her Cesarean Section.  Having some urinary incontinence with sneeze or cough.  Leaks a little bit.  No dysuria.   Having low back pain.  Saw chiropractor for this.   Delivered by Cesarean Section for breech.  Had gestational HTN.  Little girl is now 70 months old. Breast fed for 2 months.  UPT: Negative Urine: trace RBC, trace WBC, trace protein.    GYNECOLOGIC HISTORY: Patient's last menstrual period was 10/19/2016. Contraception:  Condoms Menopausal hormone therapy:  n/a Last mammogram:  n/a Last pap smear:   10-20-12 Neg:Neg HR HPV (per chart) -- patient states she may have had a Pap done with pregnancy         OB History    Gravida Para Term Preterm AB Living   1 1 1     1    SAB TAB Ectopic Multiple Live Births         0 1         Patient Active Problem List   Diagnosis Date Noted  . Gestational thrombocytopenia without hemorrhage ([place]) 03/05/2016  . S/P primary low transverse C-section 03/05/2016  . Morbid obesity ([place]) 03/04/2016  . 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 Anemia   . Anxiety   . Asthma   . Depression   . Dysmenorrhea   . Dyspareunia   . Fibroid   . Herpes   . Hx of varicella   . STD (sexually transmitted disease)    Herpes  . Vitamin D deficiency     Past Surgical History:  Procedure Laterality Date  . CESAREAN SECTION N/A 03/03/2016   Procedure: CESAREAN SECTION;   Surgeon: Ena Dawley, MD;  Location: [facility];  Service: Obstetrics;  Laterality: N/A;  RNFA req [facility] MD is requesting an RNFA to assist, please.  -ap  . TONSILLECTOMY  1991    Current Outpatient Prescriptions  Medication Sig Dispense Refill  . albuterol (PROVENTIL HFA;VENTOLIN HFA) 108 (90 Base) MCG/ACT inhaler Inhale into the lungs as needed.    Marland Kitchen albuterol (PROVENTIL) (2.5 MG/3ML) 0.083% nebulizer solution as needed.    . fluticasone (FLONASE) 50 MCG/ACT nasal spray Place 1 spray into both nostrils daily. 16 g 0  . loratadine (CLARITIN) 10 MG tablet Take 10 mg by mouth daily.    . montelukast (SINGULAIR) 10 MG tablet Take 1 tablet (10 mg total) by mouth at bedtime. 30 tablet 3  . Prenatal MV-Min-Fe Fum-FA-DHA (PRENATAL+DHA PO) Take by mouth daily.    . sertraline (ZOLOFT) 50 MG tablet Take 50 mg by mouth daily.     No current facility-administered medications for this visit.      ALLERGIES: Molds & smuts; Cholestatin; and Dust mite mixed allergen ext  [mite (d. farinae)]  Family History  Problem Relation Age of Onset  . Hypertension Father   .  Hyperlipidemia Father   . Diabetes Maternal Grandmother   . Stroke Maternal Grandmother   . Asthma Mother   . Breast cancer Maternal Aunt 50       breast cancer    Social History   Social History  . Marital status: Married    Spouse name: N/A  . Number of children: N/A  . Years of education: N/A   Occupational History  . Not on file.   Social History Main Topics  . Smoking status: Never Smoker  . Smokeless tobacco: Never Used  . Alcohol use 0.6 oz/week    1 Standard drinks or equivalent per week     Comment: ocassional  . Drug use: No  . Sexual activity: Yes    Partners: Male    Birth control/ protection: None   Other Topics Concern  . Not on file   Social History Narrative  . No narrative on file    ROS:  Pertinent items are noted in HPI.  PHYSICAL EXAMINATION:    BP 124/76 (BP Location: Right  Arm, Patient Position: Sitting, Cuff Size: Normal)   Pulse 68   Resp 16   Wt 274 lb (124.3 kg)   LMP 10/19/2016   BMI 48.54 kg/m     General appearance: alert, cooperative and appears stated age Lungs: clear to auscultation bilaterally Heart: regular rate and rhythm Abdomen: obese, soft, non-tender, no masses,  no organomegaly   Pelvic: External genitalia:  no lesions              Urethra:  normal appearing urethra with no masses, tenderness or lesions              Bartholins and Skenes: normal                 Vagina: normal appearing vagina with normal color and discharge, no lesions              Cervix: no lesions                Bimanual Exam:  Uterus:  normal size, contour, position, consistency, mobility, non-tender.  Exam limited by [facility].                                               Adnexa: no mass, fullness, tenderness              Chaperone was present for exam.  ASSESSMENT  Menorrhagia with irregular cycle recently.  No current bleeding. Hx fibroids.  Need for contraception.  Hx gestational HTN.  Normal BP now.  Hx Cesarean Section.  PLAN  Dicussed options for cycle regulation and contraception.  OCPs, Depo Provera, Mirena IUD, Nexplanon. Discussed Mirena IUD in detail.  Brochure given. Discussed Cytotec and paracervical block for IUD placement.  Return for pelvic US.  Get most recent pap from Dr. Doran Stabler office.    An After Visit Summary was printed and given to the patient.  __25___ minutes face to face time of which over 50% was spent in counseling.

## 2016-11-11 ENCOUNTER — Encounter: Payer: Self-pay | Admitting: Obstetrics and Gynecology

## 2016-11-11 ENCOUNTER — Ambulatory Visit (INDEPENDENT_AMBULATORY_CARE_PROVIDER_SITE_OTHER): Payer: Managed Care, Other (non HMO) | Admitting: Obstetrics and Gynecology

## 2016-11-11 VITALS — BP 124/76 | HR 68 | Resp 16 | Wt 274.0 lb

## 2016-11-11 DIAGNOSIS — N926 Irregular menstruation, unspecified: Secondary | ICD-10-CM | POA: Diagnosis not present

## 2016-11-11 DIAGNOSIS — D259 Leiomyoma of uterus, unspecified: Secondary | ICD-10-CM

## 2016-11-11 LAB — POCT URINE PREGNANCY: PREG TEST UR: NEGATIVE

## 2016-11-12 ENCOUNTER — Telehealth: Payer: Self-pay | Admitting: Obstetrics and Gynecology

## 2016-11-12 NOTE — Telephone Encounter (Signed)
Placed call to patient review benefits for a recommended ultrasound. Left voicemail requesting a return call.

## 2016-11-16 NOTE — Telephone Encounter (Signed)
Placed call to patient to review benefits for recommended ultrasound. Left voicemail requesting a return call

## 2016-11-17 NOTE — Telephone Encounter (Signed)
Spoke with patient regarding benefit for recommended ultrasound. Patient understood and agreeable. Patient ready to schedule. Patient scheduled 11/19/16 with Dr Quincy Simmonds. Patient aware of date, arrival time and cancellation policy. No further questions.

## 2016-11-19 ENCOUNTER — Ambulatory Visit (INDEPENDENT_AMBULATORY_CARE_PROVIDER_SITE_OTHER): Payer: Managed Care, Other (non HMO)

## 2016-11-19 ENCOUNTER — Encounter: Payer: Self-pay | Admitting: Obstetrics and Gynecology

## 2016-11-19 ENCOUNTER — Ambulatory Visit (INDEPENDENT_AMBULATORY_CARE_PROVIDER_SITE_OTHER): Payer: Managed Care, Other (non HMO) | Admitting: Obstetrics and Gynecology

## 2016-11-19 VITALS — BP 132/80 | HR 76 | Resp 16 | Wt 278.0 lb

## 2016-11-19 DIAGNOSIS — N920 Excessive and frequent menstruation with regular cycle: Secondary | ICD-10-CM

## 2016-11-19 DIAGNOSIS — D259 Leiomyoma of uterus, unspecified: Secondary | ICD-10-CM

## 2016-11-19 DIAGNOSIS — N926 Irregular menstruation, unspecified: Secondary | ICD-10-CM

## 2016-11-19 DIAGNOSIS — D252 Subserosal leiomyoma of uterus: Secondary | ICD-10-CM

## 2016-11-19 MED ORDER — NORETHINDRONE 0.35 MG PO TABS: 1.0000 | ORAL_TABLET | Freq: Every day | ORAL | 2 refills | Status: AC

## 2016-11-19 NOTE — Patient Instructions (Signed)
Norethindrone tablets (contraception) What is this medicine? NORETHINDRONE (nor eth IN drone) is an oral contraceptive. The product contains a female hormone known as a progestin. It is used to prevent pregnancy. This medicine may be used for other purposes; ask your health care provider or pharmacist if you have questions. COMMON BRAND NAME(S): Camila, Deblitane 28-Day, Errin, Heather, [place], Jolivette, [place], Nor-QD, Nora-BE, Norlyroc, Ortho Micronor, [employer] 28-Day What should I tell my health care provider before I take this medicine? They need to know if you have any of these conditions: -blood vessel disease or blood clots -breast, cervical, or vaginal cancer -diabetes -heart disease -kidney disease -liver disease -mental depression -migraine -seizures -stroke -vaginal bleeding -an unusual or allergic reaction to norethindrone, other medicines, foods, dyes, or preservatives -pregnant or trying to get pregnant -breast-feeding How should I use this medicine? Take this medicine by mouth with a glass of water. You may take it with or without food. Follow the directions on the prescription label. Take this medicine at the same time each day and in the order directed on the package. Do not take your medicine more often than directed. Contact your pediatrician regarding the use of this medicine in children. Special care may be needed. This medicine has been used in female children who have started having menstrual periods. A patient package insert for the product will be given with each prescription and refill. Read this sheet carefully each time. The sheet may change frequently. Overdosage: If you think you have taken too much of this medicine contact a poison control center or emergency room at once. NOTE: This medicine is only for you. Do not share this medicine with others. What if I miss a dose? Try not to miss a dose. Every time you miss a dose or take a dose late your chance of  pregnancy increases. When 1 pill is missed (even if only 3 hours late), take the missed pill as soon as possible and continue taking a pill each day at the regular time (use a back up method of birth control for the next 48 hours). If more than 1 dose is missed, use an additional birth control method for the rest of your pill pack until menses occurs. Contact your health care professional if more than 1 dose has been missed. What may interact with this medicine? Do not take this medicine with any of the following medications: -amprenavir or fosamprenavir -bosentan This medicine may also interact with the following medications: -antibiotics or medicines for infections, especially rifampin, rifabutin, rifapentine, and griseofulvin, and possibly penicillins or tetracyclines -aprepitant -barbiturate medicines, such as phenobarbital -carbamazepine -felbamate -modafinil -oxcarbazepine -phenytoin -ritonavir or other medicines for HIV infection or AIDS -St. John's wort -topiramate This list may not describe all possible interactions. Give your health care provider a list of all the medicines, herbs, non-prescription drugs, or dietary supplements you use. Also tell them if you smoke, drink alcohol, or use illegal drugs. Some items may interact with your medicine. What should I watch for while using this medicine? Visit your doctor or health care professional for regular checks on your progress. You will need a regular breast and pelvic exam and Pap smear while on this medicine. Use an additional method of birth control during the first cycle that you take these tablets. If you have any reason to think you are pregnant, stop taking this medicine right away and contact your doctor or health care professional. If you are taking this medicine for hormone related problems, it  may take several cycles of use to see improvement in your condition. This medicine does not protect you against HIV infection (AIDS)  or any other sexually transmitted diseases. What side effects may I notice from receiving this medicine? Side effects that you should report to your doctor or health care professional as soon as possible: -breast tenderness or discharge -pain in the abdomen, chest, groin or leg -severe headache -skin rash, itching, or hives -sudden shortness of breath -unusually weak or tired -vision or speech problems -yellowing of skin or eyes Side effects that usually do not require medical attention (report to your doctor or health care professional if they continue or are bothersome): -changes in sexual desire -change in menstrual flow -facial hair growth -fluid retention and swelling -headache -irritability -nausea -weight gain or loss This list may not describe all possible side effects. Call your doctor for medical advice about side effects. You may report side effects to FDA at 1-800-FDA-1088. Where should I keep my medicine? Keep out of the reach of children. Store at room temperature between 15 and 30 degrees C (59 and 86 degrees F). Throw away any unused medicine after the expiration date. NOTE: This sheet is a summary. It may not cover all possible information. If you have questions about this medicine, talk to your doctor, pharmacist, or health care provider.  2018 Elsevier/Gold Standard (2012-02-12 16:41:35)  

## 2016-11-19 NOTE — Progress Notes (Signed)
GYNECOLOGY  VISIT   HPI: 31 y.o.   Married  Caucasian  female   G1P1001 with Patient's last menstrual period was 11/16/2016.   here for ultrasound due to spotting between cycles, painful and heavy menses.  Had intermenstrual bleeding only once.  Pad change every hour at times.  Her last cycle was very heavy.  Hx fibroids with 3 subserosal fibroids under 3 cm noted at the time of her C/S in Sept. 2017.   CBC and TSH normal in Feb 2018.  We discussed Mirena IUD at her office visit on 11/11/16.  UPT negative on 11/11/16.  GYNECOLOGIC HISTORY: Patient's last menstrual period was 11/16/2016. Contraception:  Condoms Menopausal hormone therapy:  n/a Last mammogram:  n/a Last pap smear:    10-20-12 Neg:Neg HR HPV (per chart) -- patient states she may have had a Pap done with pregnancy         OB History    Gravida Para Term Preterm AB Living   1 1 1     1    SAB TAB Ectopic Multiple Live Births         0 1         Patient Active Problem List   Diagnosis Date Noted  . Gestational thrombocytopenia without hemorrhage ([place]) 03/05/2016  . S/P primary low transverse C-section 03/05/2016  . Morbid obesity ([place]) 03/04/2016  . 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 Anemia   . Anxiety   . Asthma   . Depression   . Dysmenorrhea   . Dyspareunia   . Fibroid   . Herpes   . Hx of varicella   . STD (sexually transmitted disease)    Herpes  . Vitamin D deficiency     Past Surgical History:  Procedure Laterality Date  . CESAREAN SECTION N/A 03/03/2016   Procedure: CESAREAN SECTION;  Surgeon: Ena Dawley, MD;  Location: [facility];  Service: Obstetrics;  Laterality: N/A;  RNFA req [facility] MD is requesting an RNFA to assist, please.  -ap  . TONSILLECTOMY  1991    Current Outpatient Prescriptions  Medication Sig Dispense Refill  . albuterol (PROVENTIL HFA;VENTOLIN HFA) 108 (90 Base) MCG/ACT inhaler Inhale into the lungs  as needed.    Marland Kitchen albuterol (PROVENTIL) (2.5 MG/3ML) 0.083% nebulizer solution as needed.    . fluticasone (FLONASE) 50 MCG/ACT nasal spray Place 1 spray into both nostrils daily. 16 g 0  . loratadine (CLARITIN) 10 MG tablet Take 10 mg by mouth daily.    . montelukast (SINGULAIR) 10 MG tablet Take 1 tablet (10 mg total) by mouth at bedtime. 30 tablet 3  . Prenatal MV-Min-Fe Fum-FA-DHA (PRENATAL+DHA PO) Take by mouth daily.    . sertraline (ZOLOFT) 50 MG tablet Take 50 mg by mouth daily.     No current facility-administered medications for this visit.      ALLERGIES: Molds & smuts; Cholestatin; and Dust mite mixed allergen ext  [mite (d. farinae)]  Family History  Problem Relation Age of Onset  . Hypertension Father   . Hyperlipidemia Father   . Diabetes Maternal Grandmother   . Stroke Maternal Grandmother   . Asthma Mother   . Breast cancer Maternal Aunt 50       breast cancer    Social History   Social History  . Marital status: Married    Spouse name: N/A  . Number of children: N/A  . Years of education: N/A  Occupational History  . Not on file.   Social History Main Topics  . Smoking status: Never Smoker  . Smokeless tobacco: Never Used  . Alcohol use 0.6 oz/week    1 Standard drinks or equivalent per week     Comment: ocassional  . Drug use: No  . Sexual activity: Yes    Partners: Male    Birth control/ protection: None   Other Topics Concern  . Not on file   Social History Narrative  . No narrative on file    ROS:  Pertinent items are noted in HPI.  PHYSICAL EXAMINATION:    BP 132/80 (BP Location: Left Arm, Patient Position: Sitting, Cuff Size: Large)   Pulse 76   Resp 16   Wt 278 lb (126.1 kg)   LMP 11/16/2016   BMI 49.25 kg/m     General appearance: alert, cooperative and appears stated age  Pelvic ultrasound: 9 mm fibroid.  EMS 4.58 mm.  Normal ovaries.  No free fluid.  ASSESSMENT  Menorrhagia.  Tiny fibroid. Labile BP per patient.   Hx gestational HTN.  Need for contraception.   PLAN  Discussed fibroids and the effect of pregnancy of fibroids. We reviewed progesterone options of Camilla, Depo Provera, Nexplanon, and Mirena.  Patient prefers to try the [place] first.  Instructed in use and back up protection.  Follow up in 3 months.    An After Visit Summary was printed and given to the patient.  __25____ minutes face to face time of which over 50% was spent in counseling.

## 2016-11-19 NOTE — Progress Notes (Signed)
Encounter reviewed by Dr. Clorissa Gruenberg Amundson C. Silva.  

## 2016-11-26 ENCOUNTER — Telehealth: Payer: Self-pay | Admitting: Obstetrics and Gynecology

## 2016-11-26 NOTE — Telephone Encounter (Signed)
Left message for patient to return call regarding records release.

## 2016-12-05 ENCOUNTER — Emergency Department (HOSPITAL_COMMUNITY)
Admission: EM | Admit: 2016-12-05 | Discharge: 2016-12-06 | Disposition: A | Discharge status: Home / Self Care | Payer: Managed Care, Other (non HMO) | Attending: Emergency Medicine | Admitting: Emergency Medicine

## 2016-12-05 ENCOUNTER — Encounter (HOSPITAL_COMMUNITY): Payer: Self-pay | Admitting: Emergency Medicine

## 2016-12-05 ENCOUNTER — Emergency Department (HOSPITAL_COMMUNITY): Payer: Managed Care, Other (non HMO)

## 2016-12-05 DIAGNOSIS — J45909 Unspecified asthma, uncomplicated: Secondary | ICD-10-CM | POA: Insufficient documentation

## 2016-12-05 DIAGNOSIS — K30 Functional dyspepsia: Secondary | ICD-10-CM | POA: Insufficient documentation

## 2016-12-05 DIAGNOSIS — R079 Chest pain, unspecified: Secondary | ICD-10-CM | POA: Diagnosis present

## 2016-12-05 DIAGNOSIS — F419 Anxiety disorder, unspecified: Secondary | ICD-10-CM | POA: Insufficient documentation

## 2016-12-05 LAB — BASIC METABOLIC PANEL
ANION GAP: 8 (ref 5–15)
BUN: 12 mg/dL (ref 6–20)
CO2: 25 mmol/L (ref 22–32)
Calcium: 8.4 mg/dL — ABNORMAL LOW (ref 8.9–10.3)
Chloride: 103 mmol/L (ref 101–111)
Creatinine, Ser: 0.75 mg/dL (ref 0.44–1.00)
GFR calc non Af Amer: >60 mL/min (ref >60)
GLUCOSE: 107 mg/dL — AB (ref 65–99)
POTASSIUM: 3.2 mmol/L — AB (ref 3.5–5.1)
Sodium: 136 mmol/L (ref 135–145)

## 2016-12-05 LAB — CBC
HEMATOCRIT: 37.6 % (ref 36.0–46.0)
HEMOGLOBIN: 12.5 g/dL (ref 12.0–15.0)
MCH: 28.5 pg (ref 26.0–34.0)
MCHC: 33.2 g/dL (ref 30.0–36.0)
MCV: 85.8 fL (ref 78.0–100.0)
Platelets: 205 10*3/uL (ref 150–400)
RBC: 4.38 MIL/uL (ref 3.87–5.11)
RDW: 12.7 % (ref 11.5–15.5)
WBC: 12.7 10*3/uL — ABNORMAL HIGH (ref 4.0–10.5)

## 2016-12-05 LAB — I-STAT TROPONIN, ED: Troponin i, poc: 0 ng/mL (ref 0.00–0.08)

## 2016-12-05 MED ORDER — GI COCKTAIL ~~LOC~~
30.0000 mL | Freq: Once | ORAL | Status: AC
  Administered 2016-12-06: 30 mL via ORAL
  Filled 2016-12-05: qty 30

## 2016-12-05 NOTE — ED Provider Notes (Signed)
[facility] DEPT Provider Note   CSN: [id number] Arrival date & time: 12/05/16  2232     History   Chief Complaint Chief Complaint  Patient presents with  . Chest Pain    HPI Renee Stafford is a 31 y.o. female.   HPI   31 year old female presents today with complaints of chest pain.  Patient reports symptoms started approximately 6 days ago with central chest tightness.  Patient notes this is typical of her asthma as she gets tightness.  She notes usually this resolves on its own.  She reports the tightness was also associated with indigestion and burning in her throat.  She notes frequent episodes of belching which seems to relieve her symptoms.  Patient notes that she also has been taking Zantac which has improved her chest discomfort.  Patient was seen in urgent care and referred to her primary care and attempt to have outpatient stress test done.  Patient denies any significant shortness of breath, cough or fever.  She denies any history of cardiac abnormalities, denies any history DVT or PE or any significant risk factors.  Patient reports she is a non-smoker.  She denies any swelling to her lower extremities, fever chills, nausea vomiting or abdominal pain.    Past Medical History:  Diagnosis Date  . Anemia   . Anxiety   . Asthma   . Depression   . Dysmenorrhea   . Dyspareunia   . Fibroid   . Herpes   . Hx of varicella   . STD (sexually transmitted disease)    Herpes  . Vitamin D deficiency     Patient Active Problem List   Diagnosis Date Noted  . Gestational thrombocytopenia without hemorrhage ([place]) 03/05/2016  . S/P primary low transverse C-section 03/05/2016  . Morbid obesity ([place]) 03/04/2016  . 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 CESAREAN SECTION N/A 03/03/2016   Procedure: CESAREAN SECTION;  Surgeon: Ena Dawley, MD;  Location: [facility];  Service: Obstetrics;   Laterality: N/A;  RNFA req [facility] MD is requesting an RNFA to assist, please.  -ap  . TONSILLECTOMY  1991    OB History    Gravida Para Term Preterm AB Living   1 1 1     1    SAB TAB Ectopic Multiple Live Births         0 1       Home Medications    Prior to Admission medications   Medication Sig Start Date End Date Taking? Authorizing Provider  albuterol (PROVENTIL HFA;VENTOLIN HFA) 108 (90 Base) MCG/ACT inhaler Inhale 1-2 puffs into the lungs every 4 (four) hours as needed for shortness of breath.  06/11/15  Yes [provider]  albuterol (PROVENTIL) (2.5 MG/3ML) 0.083% nebulizer solution Take 2.5 mg by nebulization every 4 (four) hours as needed for wheezing or shortness of breath.  07/10/16 07/10/17 Yes [provider]  fluticasone (FLONASE) 50 MCG/ACT nasal spray Place 1 spray into both nostrils daily. 04/22/14  Yes Melony Overly, MD  levocetirizine (XYZAL) 5 MG tablet Take 5 mg by mouth every evening.   Yes [provider]  loratadine (CLARITIN) 10 MG tablet Take 10 mg by mouth daily.   Yes [provider]  norethindrone (MICRONOR,CAMILA,ERRIN) 0.35 MG tablet Take 1 tablet (0.35 mg total) by mouth daily. 11/19/16  Yes Nunzio Cobbs, MD  sertraline (ZOLOFT) 50 MG tablet Take 50 mg  by mouth daily.   Yes [provider]  montelukast (SINGULAIR) 10 MG tablet Take 1 tablet (10 mg total) by mouth at bedtime. Patient not taking: Reported on 12/05/2016 10/26/16   Jaynee Eagles, PA-C  omeprazole (PRILOSEC) 20 MG capsule Take 1 capsule (20 mg total) by mouth daily. 12/06/16   Okey Regal, PA-C    Family History Family History  Problem Relation Age of Onset  . Hypertension Father   . Hyperlipidemia Father   . Diabetes Maternal Grandmother   . Stroke Maternal Grandmother   . Asthma Mother   . Breast cancer Maternal Aunt 50       breast cancer    Social History Social History  Substance Use Topics  . Smoking status: Never Smoker  .  Smokeless tobacco: Never Used  . Alcohol use 0.6 oz/week    1 Standard drinks or equivalent per week     Comment: ocassional     Allergies   Molds & smuts; Cholestatin; and Dust mite mixed allergen ext  [mite (d. farinae)]   Review of Systems Review of Systems  All other systems reviewed and are negative.   Physical Exam Updated Vital Signs BP 136/82   Pulse 76   Temp 98.3 F (36.8 C) (Oral)   Resp (!) 24   Ht 5\' 2"  (1.575 m)   Wt 126.1 kg (278 lb)   LMP 11/16/2016   SpO2 98%   BMI 50.85 kg/m   Physical Exam  Constitutional: She is oriented to person, place, and time. She appears well-developed and well-nourished.  HENT:  Head: Normocephalic and atraumatic.  Eyes: Conjunctivae are normal. Pupils are equal, round, and reactive to light. Right eye exhibits no discharge. Left eye exhibits no discharge. No scleral icterus.  Neck: Normal range of motion. No JVD present. No tracheal deviation present.  Cardiovascular: Normal rate, regular rhythm and normal heart sounds.  Exam reveals no gallop and no friction rub.   No murmur heard. Pulmonary/Chest: Effort normal. No stridor.  Abdominal: Soft. Bowel sounds are normal. She exhibits no distension and no mass. There is no tenderness. There is no rebound and no guarding. No hernia.  Musculoskeletal: Normal range of motion. She exhibits no edema.  Neurological: She is alert and oriented to person, place, and time. Coordination normal.  Skin: Skin is warm.  Psychiatric: She has a normal mood and affect. Her behavior is normal. Judgment and thought content normal.  Nursing note and vitals reviewed.    ED Treatments / Results  Labs (all labs ordered are listed, but only abnormal results are displayed) Labs Reviewed  BASIC METABOLIC PANEL - Abnormal; Notable for the following:       Result Value   Potassium 3.2 (*)    Glucose, Bld 107 (*)    Calcium 8.4 (*)    All other components within normal limits  CBC - Abnormal;  Notable for the following:    WBC 12.7 (*)    All other components within normal limits  I-STAT TROPOININ, ED    EKG  EKG Interpretation  Date/Time:  Saturday December 05 2016 22:35:41 EDT Ventricular Rate:  78 PR Interval:  122 QRS Duration: 98 QT Interval:  378 QTC Calculation: 430 R Axis:   74 Text Interpretation:  Normal sinus rhythm Normal ECG No previous ECGs available Confirmed by Ezequiel Essex [phone]) on 12/05/2016 11:24:21 PM       Radiology Dg Chest 2 View  Result Date: 12/05/2016 CLINICAL DATA:  Chest discomfort with cough  EXAM: CHEST  2 VIEW COMPARISON:  None. FINDINGS: The heart size and mediastinal contours are within normal limits. Both lungs are clear. The visualized skeletal structures are unremarkable. IMPRESSION: No active cardiopulmonary disease. Electronically Signed   By: Donavan Foil M.D.   On: 12/05/2016 23:35    Procedures Procedures (including critical care time)  Medications Ordered in ED Medications  gi cocktail (Maalox,Lidocaine,Donnatal) (30 mLs Oral Given 12/06/16 0034)     Initial Impression / Assessment and Plan / ED Course  I have reviewed the triage vital signs and the nursing notes.  Pertinent labs & imaging results that were available during my care of the patient were reviewed by me and considered in my medical decision making (see chart for details).     Final Clinical Impressions(s) / ED Diagnoses   Final diagnoses:  Indigestion    Assessment/Plan: 31 year old female presents today with complaints of indigestion and chest discomfort.  Patient notes a past medical history of indigestion, reports this feels similar.  She has belching, GI cocktail resolved her symptoms.  She does not have any signs consistent with ACS, she has a low heart score negative troponin and reassuring EKG.  She is PERC negative, very low suspicion for PE, dissection, or any other significant life-threatening etiology.  Patient does have outpatient follow-up  with cardiology for stress test she is encouraged to continue her outpatient follow-up, use Prilosec, return to emergency room if she develops any new or worsening signs or symptoms.  Patient verbalized understanding and agreement to today's plan had no further questions or concerns  New Prescriptions Discharge Medication List as of 12/06/2016  1:07 AM    START taking these medications   Details  omeprazole (PRILOSEC) 20 MG capsule Take 1 capsule (20 mg total) by mouth daily., Starting Sun 12/06/2016, Print         Saleemah Mollenhauer, [place], PA-C 12/06/16 0881    Ezequiel Essex, MD 12/06/16 [phone]

## 2016-12-05 NOTE — ED Notes (Signed)
Patient transported to X-ray 

## 2016-12-05 NOTE — ED Triage Notes (Signed)
Reports central chest pain for a week as well as "worse heartburn of her life".  Was seen by family physician.  Reports they are supposed to be setting her up for a stress test.  Reports heartburn is better with zantac.

## 2016-12-06 MED ORDER — OMEPRAZOLE 20 MG PO CPDR: 20.0000 mg | DELAYED_RELEASE_CAPSULE | Freq: Every day | ORAL | 0 refills | Status: AC

## 2016-12-06 NOTE — Discharge Instructions (Signed)
Please read attached information. If you experience any new or worsening signs or symptoms please return to the emergency room for evaluation. Please follow-up with your primary care provider or specialist as discussed. Please use medication prescribed only as directed and discontinue taking if you have any concerning signs or symptoms.   °

## 2016-12-14 ENCOUNTER — Telehealth: Payer: Self-pay | Admitting: Obstetrics and Gynecology

## 2016-12-14 NOTE — Telephone Encounter (Signed)
Left patient a message to call back to reschedule a future appointment that was cancelled by the provider for a 3 month recheck with Dr. Quincy Simmonds.

## 2016-12-21 NOTE — Telephone Encounter (Signed)
Left patient a message to call back to reschedule a future appointment that was cancelled by the provider. °

## 2016-12-31 NOTE — Telephone Encounter (Signed)
Left patient a third message to call back to reschedule a future appointment that was cancelled by the provider for a 3 month recheck with Dr. Quincy Simmonds on 02/17/17.  Routing to provider for review since this is the third call to the patient.

## 2016-12-31 NOTE — Telephone Encounter (Signed)
Thank you for the update.  She has a medication that will need refills in September, so I believe she will call about that time for a refill.  I did go ahead and close the encounter.

## 2017-02-17 ENCOUNTER — Ambulatory Visit: Payer: Managed Care, Other (non HMO) | Admitting: Obstetrics and Gynecology

## 2017-03-05 ENCOUNTER — Telehealth: Payer: Self-pay | Admitting: Obstetrics and Gynecology

## 2017-03-05 NOTE — Telephone Encounter (Signed)
Patient cancelled her 03/10/17 reck appointment and does not want to reschedule

## 2017-03-05 NOTE — Telephone Encounter (Signed)
Left message to call Braydyn Schultes at [phone].  

## 2017-03-09 NOTE — Telephone Encounter (Signed)
I did review the chart and closed the encounter.

## 2017-03-09 NOTE — Telephone Encounter (Signed)
Routing to Dr. Antony Blackbird, ok to close encounter?

## 2017-03-10 ENCOUNTER — Ambulatory Visit: Payer: Managed Care, Other (non HMO) | Admitting: Obstetrics and Gynecology

## 2017-05-05 ENCOUNTER — Encounter: Payer: Self-pay | Admitting: Obstetrics and Gynecology

## 2019-05-08 IMAGING — CR DG CHEST 2V
2 series · 2 of 2 positions shown · non-contrast
Comparison: None.

CLINICAL DATA: Chest discomfort with cough

EXAM:
CHEST  2 VIEW

[chest pa]
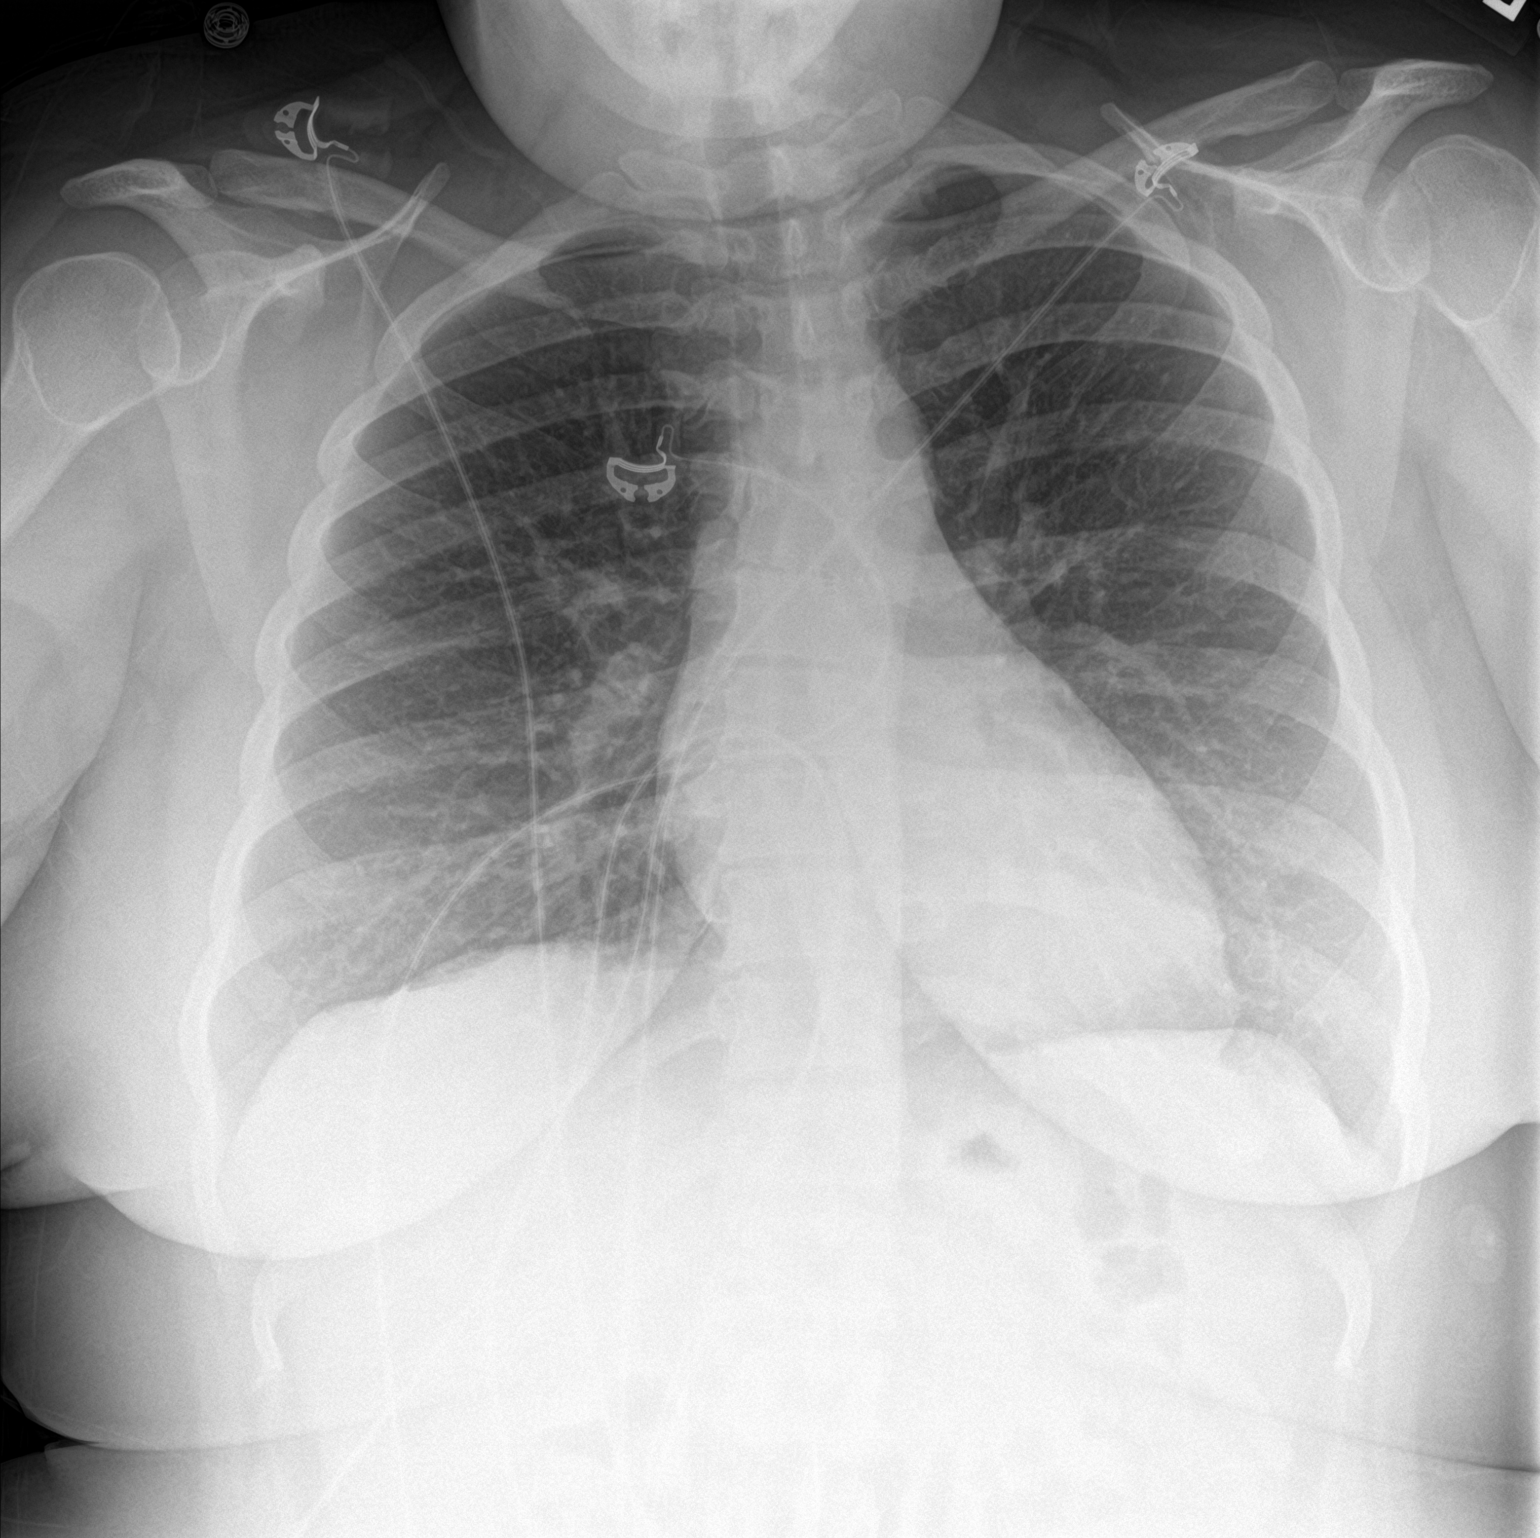

[chest lat]
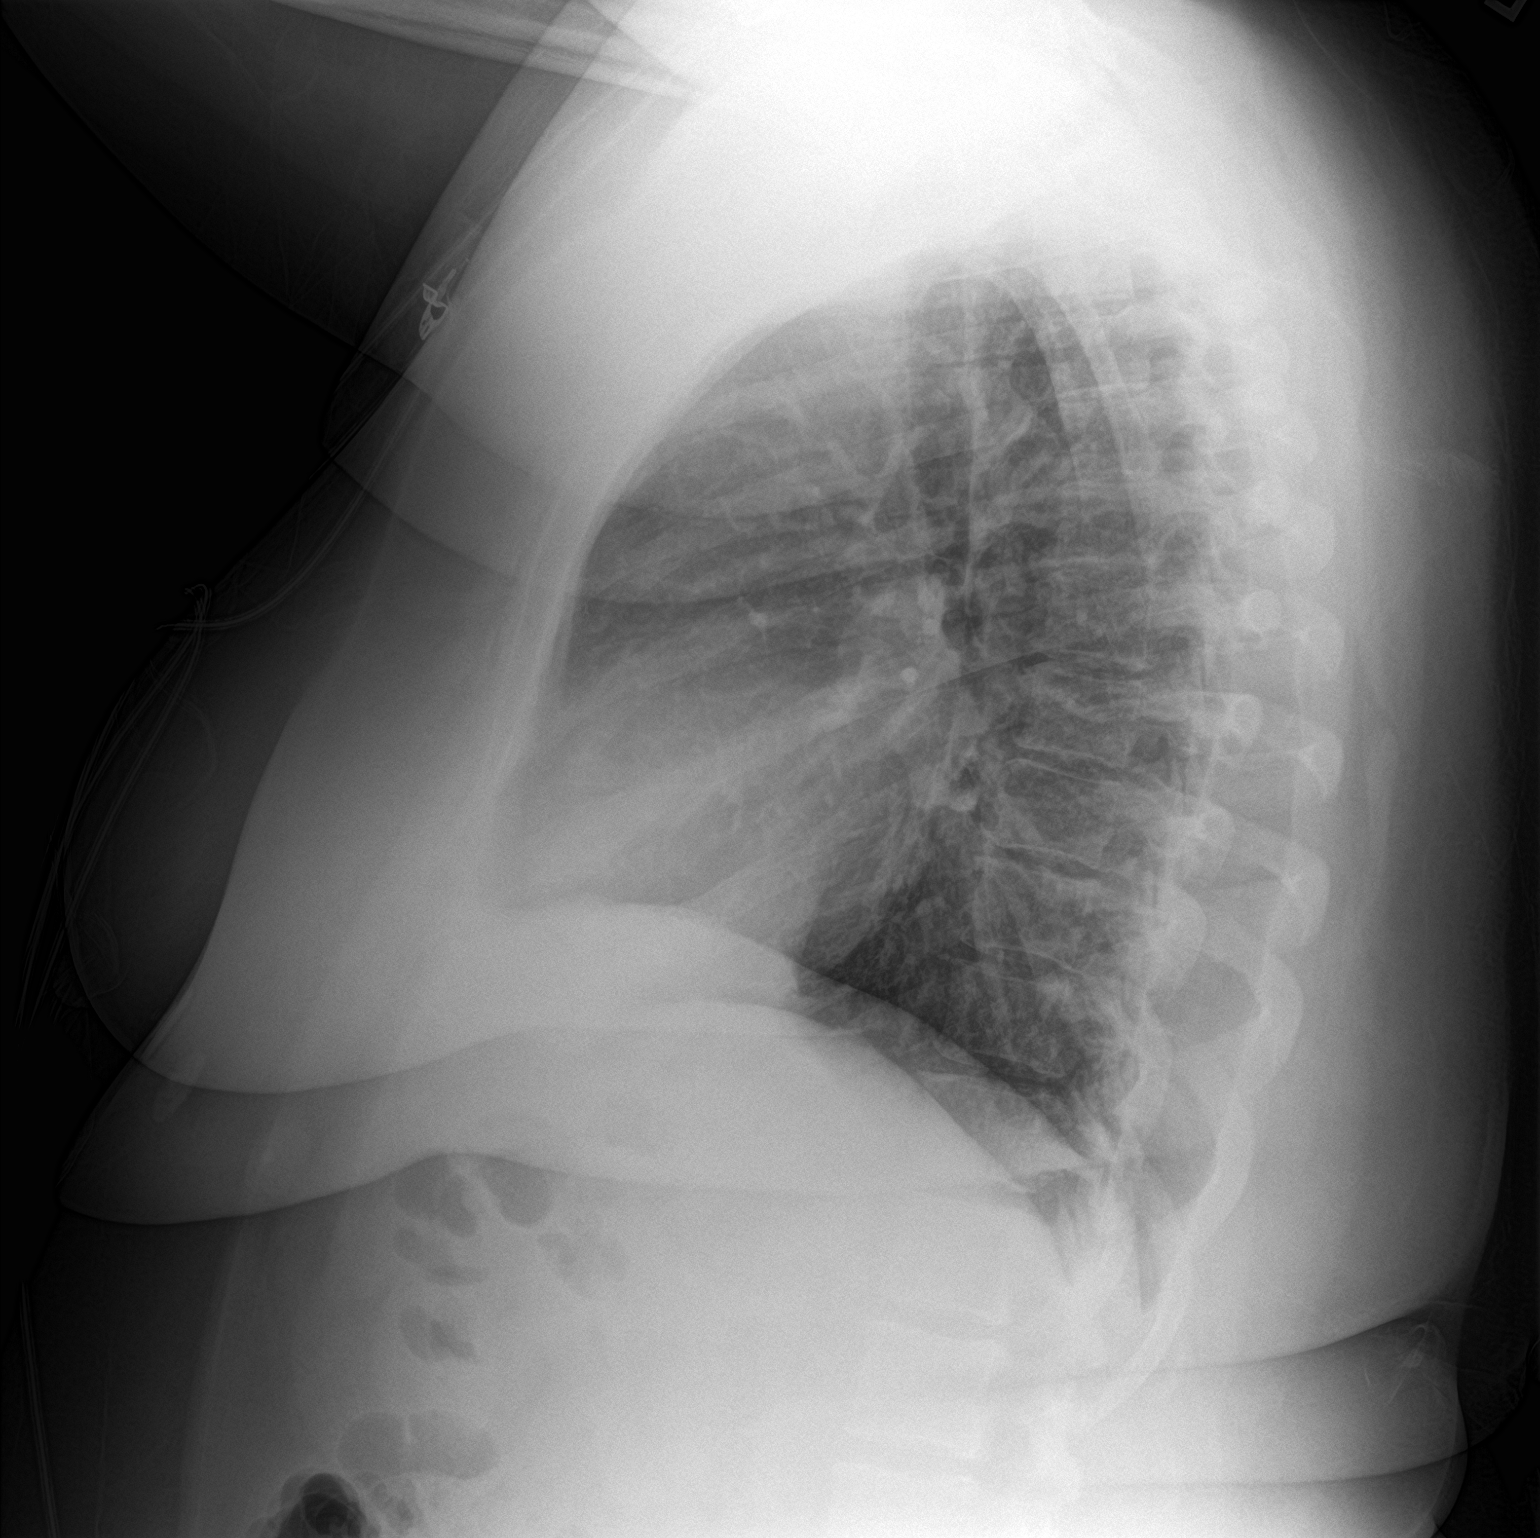

[2 of 2 positions shown; findings below may reference images not displayed]

FINDINGS: The heart size and mediastinal contours are within normal limits.
Both lungs are clear. The visualized skeletal structures are
unremarkable.
IMPRESSION: No active cardiopulmonary disease.
